# Patient Record
Sex: Male | Born: 1955 | Race: White | Hispanic: No | Marital: Married | Smoking: Current every day smoker
Health system: Southern US, Community
[De-identification: ages and names within clinical notes are randomized; demographics above are authoritative.]

## PROBLEM LIST (undated history)

## (undated) DIAGNOSIS — I209 Angina pectoris, unspecified: Secondary | ICD-10-CM

## (undated) DIAGNOSIS — E785 Hyperlipidemia, unspecified: Secondary | ICD-10-CM

## (undated) DIAGNOSIS — I252 Old myocardial infarction: Secondary | ICD-10-CM

## (undated) DIAGNOSIS — I2510 Atherosclerotic heart disease of native coronary artery without angina pectoris: Secondary | ICD-10-CM

## (undated) DIAGNOSIS — M1990 Unspecified osteoarthritis, unspecified site: Secondary | ICD-10-CM

## (undated) DIAGNOSIS — D696 Thrombocytopenia, unspecified: Secondary | ICD-10-CM

## (undated) DIAGNOSIS — R945 Abnormal results of liver function studies: Secondary | ICD-10-CM

## (undated) DIAGNOSIS — I10 Essential (primary) hypertension: Secondary | ICD-10-CM

## (undated) DIAGNOSIS — R7989 Other specified abnormal findings of blood chemistry: Secondary | ICD-10-CM

## (undated) DIAGNOSIS — E119 Type 2 diabetes mellitus without complications: Secondary | ICD-10-CM

## (undated) DIAGNOSIS — Z87898 Personal history of other specified conditions: Secondary | ICD-10-CM

## (undated) DIAGNOSIS — I219 Acute myocardial infarction, unspecified: Secondary | ICD-10-CM

## (undated) HISTORY — DX: Thrombocytopenia, unspecified: D69.6

## (undated) HISTORY — DX: Old myocardial infarction: I25.2

## (undated) HISTORY — DX: Other specified abnormal findings of blood chemistry: R79.89

## (undated) HISTORY — DX: Atherosclerotic heart disease of native coronary artery without angina pectoris: I25.10

## (undated) HISTORY — DX: Hyperlipidemia, unspecified: E78.5

## (undated) HISTORY — PX: CARDIAC CATHETERIZATION: SHX172

## (undated) HISTORY — DX: Abnormal results of liver function studies: R94.5

## (undated) HISTORY — DX: Personal history of other specified conditions: Z87.898

## (undated) HISTORY — PX: CORONARY ANGIOPLASTY WITH STENT PLACEMENT: SHX49

---

## 2014-05-31 DIAGNOSIS — I255 Ischemic cardiomyopathy: Secondary | ICD-10-CM

## 2014-05-31 HISTORY — DX: Ischemic cardiomyopathy: I25.5

## 2014-06-19 DIAGNOSIS — I2510 Atherosclerotic heart disease of native coronary artery without angina pectoris: Secondary | ICD-10-CM

## 2014-06-19 HISTORY — DX: Atherosclerotic heart disease of native coronary artery without angina pectoris: I25.10

## 2014-07-31 HISTORY — PX: CORONARY STENT PLACEMENT: SHX1402

## 2015-12-18 ENCOUNTER — Ambulatory Visit

## 2015-12-18 ENCOUNTER — Ambulatory Visit: Admitting: Emergency Medicine

## 2015-12-18 LAB — HX .AUTOMATED DIFF
CASE NUMBER: 2017140001006
HX ABSOLUTE NEUTRO COUNT: 2780 /mm3
HX BASOPHILS: 1 % — NL (ref 0.0–1.0)
HX EOSINOPHILS: 2 % — NL (ref 0.0–3.0)
HX IMMATURE GRANULOCYTES: 0 % — NL (ref 0.0–2.0)
HX LYMPHOCYTES: 34 % — NL (ref 22.0–40.0)
HX MONOCYTES: 12 % — ABNORMAL HIGH (ref 0.0–11.0)
HX NEU CT MEASURED: 2.78
HX NEUTROPHILS: 51 % — NL (ref 40.0–71.0)

## 2015-12-18 LAB — HX CBC W/ DIFF
CASE NUMBER: 2017140001006
HX HCT: 51.9 % — ABNORMAL HIGH (ref 39.0–50.0)
HX HGB: 18.1 g/dL — ABNORMAL HIGH (ref 13.0–17.0)
HX MCH: 35 pg — ABNORMAL HIGH (ref 27.0–31.0)
HX MCHC: 34.9 g/dL — NL (ref 32.0–36.0)
HX MCV: 100 fL — ABNORMAL HIGH (ref 80.0–94.0)
HX MPV: 11 fL — NL (ref 7.4–11.5)
HX NRBC PERCENT: 0 % — NL
HX PLATELET: 105 10*3/uL — ABNORMAL LOW (ref 150.0–400.0)
HX RBC: 5.17 10*6/uL — NL (ref 4.2–5.4)
HX RDW: 13.4 % — NL (ref 11.5–14.5)
HX WBC: 5.4 10*3/uL — NL (ref 3.6–10.5)

## 2015-12-18 LAB — HX COMPREHENSIVE METABOLIC PANEL
CASE NUMBER: 2017140001006
HX ALBUMIN LVL: 3.9 g/dL — NL (ref 3.5–5.0)
HX ALKALINE PHOSPHATASE: 64 U/L — NL (ref 45.0–117.0)
HX ALT: 34 U/L — NL (ref 6.0–78.0)
HX ANION GAP: 11 — NL (ref 3.0–11.0)
HX AST: 28 U/L — NL (ref 6.0–40.0)
HX BILIRUBIN TOTAL: 0.7 mg/dL — NL (ref 0.2–1.0)
HX BUN: 9 mg/dL — NL (ref 7.0–18.0)
HX CALCIUM LVL: 9.2 mg/dL — NL (ref 8.5–10.1)
HX CHLORIDE: 109 mmol/L — ABNORMAL HIGH (ref 98.0–107.0)
HX CO2: 21 mmol/L — NL (ref 21.0–32.0)
HX CREATININE: 0.907 mg/dL — NL (ref 0.55–1.3)
HX GLUCOSE LVL: 152 mg/dL — ABNORMAL HIGH (ref 65.0–110.0)
HX POTASSIUM LVL: 4 mmol/L — NL (ref 3.6–5.2)
HX SODIUM LVL: 141 mmol/L — NL (ref 136.0–145.0)
HX TOTAL PROTEIN: 7.3 g/dL — NL (ref 6.0–8.0)

## 2015-12-18 LAB — HX SST GOLD TUBE TO HOLD: CASE NUMBER: 2017140001006

## 2015-12-18 LAB — HX PT
CASE NUMBER: 2017140001006
HX INR: 1
HX PT: 10.7 s — NL (ref 9.3–11.6)

## 2015-12-18 LAB — HX TROPONIN I
CASE NUMBER: 2017140001006
CASE NUMBER: 2017140001175
HX TROPONIN I: <0.015 — NL (ref 0.015–0.045)
HX TROPONIN I: <0.015 — NL (ref 0.015–0.045)

## 2015-12-18 LAB — HX NT-PROBNP
CASE NUMBER: 2017140001006
HX NT-PROBNP: 53 pg/mL — NL (ref 0.0–900.0)

## 2015-12-18 LAB — HX GLOMERULAR FILTRATION RATE (ESTIMATED)
CASE NUMBER: 2017140001006
HX AFN AMER GLOMERULAR FILTRATION RATE: >90
HX NON-AFN AMER GLOMERULAR FILTRATION RATE: >90

## 2015-12-18 NOTE — Procedures (Signed)
cc:  Dorita Sciara M.D.    __________________________________________________________________________    NUCLEAR STRESS TEST  DATE:  12/20/2015    Please refer to Pyramis for the EKG portion stress test.    SUMMARY:  1.  10.2 mCi of Technetium-99 Myoview were injected intravenously prior to rest  imaging.  2.  0.4 mg of Lexiscan were injected intravenously by hand by a Registered Nurse  over a period of 20 seconds, followed by another 5-mL saline flush.  3.  29.8 mCi of Technetium-99 Myoview were injected immediately after, followed  by another 5-mL saline flush.  The patient then underwent SPECT imaging  approximately 60 minutes post injection.    FINDINGS:  1.  Spins Study:  Good.  2.  Gated Study:  Reveals inferior wall akinesis, mild global hypokinesis.  3.  End Diastolic Volume:  Calculated 148 mL.  4.  End Systolic Volume:  Calculated 102 mL.  5.  Ejection Fraction:  Calculated at 31%.  6.  Summed Stress Score:  22.    7.  Transient Ischemic Dilatation:  1.13.    INTERPRETATION:  1.  The technical quality of the study was good.  2.  Perfusion images at stress reveal a moderate sized inferior defect.  3.  Perfusion images at rest reveal mild improvement of the inferolateral wall.  4.  Gated SPECT imaging demonstrates mildly dilated left ventricle with reduced  left ventricular systolic function and inferior akinesis.    CONCLUSION:  1.  Abnormal study.  2.  Images suggest prior inferior myocardial infarction and potential mild  infralateral ischemia.    Dictated by:  Laurian Brim, M.D.    D:  12/20/2015 13:47:41  T:  12/20/2015 15:07:18  E:  12/20/2015 15:07:18  KM/dmb  Job# [id number]   SIGNATURE LINE    Electronically signed by Earney Mallet MD,Shandiin Eisenbeis J on 12/20/2015 at 20:39:10 EST

## 2015-12-19 LAB — HX CBC W/ DIFF
CASE NUMBER: 2017141001062
HX HCT: 48.8 % — NL (ref 39.0–50.0)
HX HGB: 16.9 g/dL — NL (ref 13.0–17.0)
HX MCH: 35 pg — ABNORMAL HIGH (ref 27.0–31.0)
HX MCHC: 34.6 g/dL — NL (ref 32.0–36.0)
HX MCV: 101 fL — ABNORMAL HIGH (ref 80.0–94.0)
HX MPV: 11.8 fL — ABNORMAL HIGH (ref 7.4–11.5)
HX NRBC PERCENT: 0 % — NL
HX PLATELET: 101 10*3/uL — ABNORMAL LOW (ref 150.0–400.0)
HX RBC: 4.84 10*6/uL — NL (ref 4.2–5.4)
HX RDW: 13.2 % — NL (ref 11.5–14.5)
HX WBC: 5.7 10*3/uL — NL (ref 3.6–10.5)

## 2015-12-19 LAB — HX BASIC METABOLIC PANEL
CASE NUMBER: 2017141001062
HX ANION GAP: 7 — NL (ref 3.0–11.0)
HX BUN: 11 mg/dL — NL (ref 7.0–18.0)
HX CALCIUM LVL: 8.7 mg/dL — NL (ref 8.5–10.1)
HX CHLORIDE: 108 mmol/L — ABNORMAL HIGH (ref 98.0–107.0)
HX CO2: 26 mmol/L — NL (ref 21.0–32.0)
HX CREATININE: 0.835 mg/dL — NL (ref 0.55–1.3)
HX GLUCOSE LVL: 99 mg/dL — NL (ref 65.0–110.0)
HX POTASSIUM LVL: 4.1 mmol/L — NL (ref 3.6–5.2)
HX SODIUM LVL: 141 mmol/L — NL (ref 136.0–145.0)

## 2015-12-19 LAB — HX .AUTOMATED DIFF
CASE NUMBER: 2017141001062
HX ABSOLUTE NEUTRO COUNT: 2970 /mm3
HX BASOPHILS: 0 % — NL (ref 0.0–1.0)
HX EOSINOPHILS: 4 % — ABNORMAL HIGH (ref 0.0–3.0)
HX IMMATURE GRANULOCYTES: 0 % — NL (ref 0.0–2.0)
HX LYMPHOCYTES: 29 % — NL (ref 22.0–40.0)
HX MONOCYTES: 13 % — ABNORMAL HIGH (ref 0.0–11.0)
HX NEU CT MEASURED: 2.97
HX NEUTROPHILS: 52 % — NL (ref 40.0–71.0)

## 2015-12-19 LAB — HX PROSTATE SPECIFIC ANTIGEN DIAGNOSTIC
CASE NUMBER: 2017141001062
HX PSA: 0.6 ng/mL — NL (ref 0.0–4.0)

## 2015-12-19 LAB — HX GLOMERULAR FILTRATION RATE (ESTIMATED)
CASE NUMBER: 2017141001062
HX AFN AMER GLOMERULAR FILTRATION RATE: >90
HX NON-AFN AMER GLOMERULAR FILTRATION RATE: >90

## 2015-12-19 LAB — HX TROPONIN I
CASE NUMBER: 2017141000504
HX TROPONIN I: <0.015 — NL (ref 0.015–0.045)

## 2015-12-19 LAB — HX IRON PROFILE
CASE NUMBER: 15234229
HX % IRON BOUND: 24 % — NL (ref 10.0–50.0)
HX FERRITIN LVL: 129 ng/mL — NL (ref 26.0–388.0)
HX IRON: 81 ug/dL — NL (ref 65.0–175.0)
HX TIBC: 337 ug/dL — NL (ref 250.0–450.0)

## 2015-12-19 LAB — HX BLUE TOP TO HOLD: CASE NUMBER: 2017141000504

## 2015-12-19 LAB — HX RED TOP TO HOLD: CASE NUMBER: 2017141001062

## 2015-12-19 LAB — HX LAVENDER TOP TO HOLD: CASE NUMBER: 2017141000504

## 2015-12-19 LAB — HX SST GOLD TUBE TO HOLD: CASE NUMBER: 2017141001062

## 2015-12-21 LAB — HX ERYTHROPOIETIN LEVEL
CASE NUMBER: 2017141001062
HX ERYTHROPOIETIN LVL: 15 mU/mL

## 2015-12-23 ENCOUNTER — Ambulatory Visit: Admitting: Cardiovascular Disease

## 2015-12-23 NOTE — Progress Notes (Signed)
* * *        **  [name]**    --- ---    45 Y old Male, DOB: 07-22-1956    [address], [place], [place] [address]    Home: [phone]    Provider: Darlyn Chamber, MD        * * *    Telephone Encounter    ---    Answered by   Illene Bolus, Francois Elk  Date: 12/23/2015         Time: 12:23 PM    Reason   STRESS TEST    --- ---            Action Taken   [facility] , MD 12/23/2015 12:25:45 PM > Please call:  Stress test with small abnormality and if he is feeling well, will discuss  with him at appointment next month Nogueira,Ana 12/24/2015 10:55:26 AM > called  pt LMOM                * * *                ---          * * *          Patient: [name], [name] DOB: 1955-10-26 Provider: Darlyn Chamber, MD  12/23/2015    ---    Note generated by eClinicalWorks EMR/PM Software ([email or web address])

## 2015-12-24 ENCOUNTER — Ambulatory Visit

## 2016-01-14 ENCOUNTER — Ambulatory Visit: Admitting: Cardiovascular Disease

## 2016-01-14 NOTE — Progress Notes (Signed)
* * *        **  [name]**    --- ---    34 Y old Male, DOB: 1955-09-18    [address], [place], [place] [address]    Home: [phone]    Provider: Darlyn Chamber, MD        * * *    Telephone Encounter    ---    Answered by   Illene Bolus, Dolphus Linch  Date: 01/14/2016         Time: 05:44 PM    Reason   ECHO    --- ---            Action Taken   [facility] , MD 01/14/2016 5:44:33 PM > Please schedule  echo to reassess LVEF Man,Sokhom 01/17/2016 4:33:58 PM > call and spoke w/  p.t's wife, booked echo on 02/09/16 at 8:00am. Emailed portuguese interp.                * * *                ---          * * *          PatientJANIS, [name] DOB: [birth year] Provider: Darlyn Chamber, MD  01/14/2016    ---    Note generated by eClinicalWorks EMR/PM Software ([email or web address])

## 2016-01-14 NOTE — Progress Notes (Signed)
* * *         [facility], [facility]**        ---    Lawernce Ion. Sindy Messing, MD [facility] Kieth Brightly. Donnetta Simpers, MD [facility] Barbera Setters Byrd Hesselbach, MD  [facility];    Darlyn Chamber, MD [facility] Roosvelt Maser, MD [facility] Shanon Brow, MD [facility]  Nile Dear. Audley Hose, MD [facility]    Kandis Mannan A. Karie Mainland, MD [facility] Johnell Comings. MacNaught, MD [facility] Mitchel Honour, MD Erma Bodiford, MD [facility]    Colletta Maryland, MD Weston Anna, MD [facility] Kerby Moors, NP Maximino Greenland ,  NP        * * *     **Patient Name:** [name]   **Date:** 01/14/2016    --- ---     **DOB:** 1955-08-21     **Referring Provider:** Dorita Sciara, MD   **Appointment Provider:** Trecia Rogers, MD        * * *    01/14/2016  Progress Notes: Trecia Rogers, MD    --- ---    ---        Current Medications    ---    Taking     * Aspirin 325 mg 1 tab Oral qd    ---    * clopidogrel 75 mg Tablet 1 tab(s) orally once a day    ---    * citalopram 20 mg tablet 1 tab(s) orally once a day    ---    * cyclobenzaprine 10 mg tablet 1 tab(s) orally Q.D.    ---    * lisinopril 5 mg Tablet 1 tab(s) orally once a day at bedtime    ---    * Vitamin D3 50,000 intl units capsule 1 cap(s) orally once a wk    ---    * isosorbide mononitrate 30 mg tablet, extended release 1 tab(s) orally once a day (in the morning)    ---    * furosemide (Lasix) 20 mg tablet 1 tab(s) orally mon-wed-fri    ---    * Vitamin B12 1000 mcg tablet 1 tab(s) orally once a day    ---    * meclizine 25 mg tablet 1 tab(s) orally 3 times a day prn    ---    Not-Taking    * atorvastatin (Lipitor) 20 mg tablet 1 tab(s) orally once a day (at bedtime)    ---    * Medication List reviewed and reconciled with the patient    ---      Past Medical History    ---      CAD --> 90% OMB1 s/p DES (Promus 2x5 x 20) and 100% pRCA s/p DES(Promus 2x5  x 32), diffuse disease otherwise    ---    CM with LVEF 35% by cath and 45% by echo 11/15    ---    Hyperlipidemia    ---    Depression    ---    Vertigo    ---    ETOH in past     ---    Prior Smoker    ---    Denies hx of HTN, DM, CVA, TIA, MI, asthma, or GI bleeds    ---    Thrombocytopenia (low 100s), seen by hematologist [facility] 2/16    ---      Family History    ---      Father: deceased 71 yrs    ---    Mother:  deceased 22 yrs    ---    Siblings: alive, diagnosed with Heart Disease    ---    All brothers (4) with heart diease -    Oldest brother had CABG    Middle brother with PTCI    younger brother PPM.    ---      Social History    ---    Smoking Status: current smoker, How often do you smoke cigarettes? every day,  Patient counseled on the dangers of tobacco use: 01/14/2016.    Advised to lose weight: Yes .    No Presence of Falls .    ETOH: Prior heavy drinker; now not drinking (1 year or so).    no Recreational drug use.   Married; 4 chidren; workes as Administrator, arts;  originally from [place].    ---      Allergies    ---      atorvastatin: myalgias with 20 mg qd dose: Side Effects    ---        Reason for Appointment    ---      1\. F/u CAD, LV dysfunction s/p 2 vessel PTCI, EF 35-45%, HTN, dyslipidemia    ---    2\. Cigarette abuse    ---      History of Present Illness    ---     _HPI_ :    60 year old man presents today for followup and is accompanied by interpreter    Coronary artery disease s/p PTCI RCA and OMB (drug-eluting stents) 11/15:  Feels well and denies exertional chest, arm or jaw symptoms. He can walk 1  mile without difficulty although not sure whether he can walk 2 miles. He does  c/o intermittent epigastric symptoms    LV systolic dysfunction and ischemic cardiomyopathy: Denies any shortness of  breath with current level of activity and no orthopnea or PND. Somewhat  careful with salt in his diet and no orthopnea or PND. Denis palpitations,  unprovoked dizzy spells or syncope    Hyperlipidemia: Developed myalgias related to atorvastatin with bilateral leg  pains that resolved completely off atorvastatin and is currently not on a  statin    Cigar/Cigarette  abuse: smoking 4-5 cig/day.      Vital Signs    ---    HR 61, BP 142/90, Wt 176, Ht 68, BMI 26.76, Repeat BP 142/78, Med Assist: pd.      Examination    ---     _Reveals_ :    General appearance Well appearing man in NAD, accompanied by interpreter.  HEENT Normocephalic, atraumatic, Sclera anicteric, oropharynx clear. Neck exam  No JVD/bruits, Trachea in midline. Chest Symetrical to expansion . Lungs clear  to auscultation . Cardiac exam Nondisplaced PMI, Regular heart sounds without  S3, S4, murmurs or rubs, No heaves or thrills, Normal S1,physiologically split  S2. Abdomen Soft, nondistended, nontender with normal bowel sounds, No bruits  or pulsatile masses. Extremity No edema, clubbing or cyanosis, pulses 2+.  Neuro Grossly nonfocal motor exam. Skin warm and dry without rashes. Psych  Mood is appropriate; alert and oriented times 3.          Data    ---     _EKG (reviewed personally)_ :    NSR @ 61, OIMI and IVCD, Delayed R wave progression, minor nonspecific lateral  ST changes, mildly prolonged QT/QTc - similar to prior .    _Echo_ :    2/16: LVEF 45%, severe hypokinesis of  inferior wall and inferoseptaum a/2 mild  to moderate MR, similar to 11/15.    _Ankle Brachial Index_ :    12/16: Normal (1.2 R and 1.3 L).    _Lexiscan Myoview_ :    5/17 [facility]: LVEF 31% with inferior akinesis; inferior scar and mild peri-infarct  ischemia .    _Cardiac catheterization_ :    11/15: EF 35%, mild MR, diffuse CAD, pLAD 30%, mLAD 20 - 30%, pRCA 100% s/p  PTCI (2.5x32 Promus) DES and OMB1 90% stenosis s/p PTCI (2.5x 20 Promus) DES.          Impression/Recommendations    ---       **1\. Native vessel CAD without anginal symptoms**    Stop Aspirin, 325 mg, 1 tab, Oral, qd    Start Aspirin , 81 mg, 1 tablet, once daily, 90 Days, Refills 4    Stop isosorbide mononitrate tablet, extended release, 30 mg, 1 tab(s), orally,  once a day (in the morning)    _IMAGING: **EKG_    Clinical Notes: 2 vessel coronary disease with occluded right  coronary artery  and OMB stenosis in setting of ACS, s/p PTCI of both lesions with drug-eluting  stents 11/15 without anginal symptoms. Stress test 5/17 reviewed with him  notable for moderate sized inferior scar and mild amount of peri-infact  ischemia. In light of absence of symptoms and mild peri-infarct ischemia,  would favor continued medical therapy and he is on board. In terms of medical  therapy, recommend reducing aspirin to 81 mg qd and rationale d/w him and  epigastric symptoms may be related to higher dose aspirin and smoking. No  clear role for Imdur at this time and recommend discontinuing as well. To call  for any exertional symptoms and 911 for persistent symptoms > 20 min .    ---        **2\. Left ventricular failure**    Stop furosemide (Lasix) tablet, 20 mg, 1 tab(s), orally, Mon-Wed-Fridays    _IMAGING: **Echocardiogram (Ordered for 01/14/2016)_    Clinical Notes: NYHA II symptoms without exam evidence for CHF with LVEF 45%  at last echo although at recent stress test, calculated LVEF 31%. I suspect  that echo EF is closer to accurate and small risk for potentially fatal  arrythmias d/w him and to call for any new symptoms. I do recommend repeating  echo to reassess LVEF and to continue with salt restriction. No CHF and at  this time, will discontinue lasix 20 mg 3/week and change to HCTZ.        **3\. Hypercholesterolemia**    Stop atorvastatin (Lipitor) tablet, 20 mg, 1 tab(s), orally, once a day (at  bedtime)    Start Crestor tablet, 20 mg, 1 tab(s), orally, once a day (at bedtime), 30  day(s), 30, Refills 11    _LAB: Liver function tests (Ordered for 03/15/2016)_    _LAB: Lipid Profile (Ordered for 03/15/2016)_    Clinical Notes: Had atorvastatin related myalgias with bilateral leg pains  that completely resolved off atorvastatin and feels quite well at this point  with normal ABI PVR. We discussed the rationale for statins and recommend  trial of Crestor 20 mg daily and if tolerated, plan  is to repeat lipids in 2  months time to assess adequacy of therapy.        **4\. Abnormal stress test with nuclear imaging**    _IMAGING: **Echocardiogram (Ordered for 01/14/2016)_    Clinical Notes: As above.        **  5\. Cigarette smoker, current**    Clinical Notes: Disastrous consequences of continued smoking discussed with  him and strongly emphasized absolute and complete smoke cessation.        **6\. S/p PTCI**    Clinical Notes: Rationale for dual antiplatelet therapy discussed with him and  to continue for now given ACS, 2 stents and continued smoking.        **7\. Hypertension**    Stop lisinopril Tablet, 5 mg, 1 tab(s), orally, once a day at bedtime    Start lisinopril-hctz tablet, 12.5 mg-10 mg, 1 tab(s), orally, once a day, 30  day(s), 30 Tablet, Refills 11    _LAB: BMP (Ordered for 01/28/2016)_    Clinical Notes: Has hypertension and blood pressure is mildly elevated today.  Isosorbide mononitrate and Lasix are being discontinued and I recommend  increasing lisinopril to 10 mg a day and adding hydrochlorothiazide 12.5 mg  daily to his regimen and combination pill prescription sent to his pharmacy  and to be taken once a day in the morning. Potential side effects relating to  both lisinopril and hydrochlorothiazide discussed with him and will arrange  for chem 6 in 2 weeks' time to assess potassium and renal function. Outpatient  blood pressure checks are recommended with plans for follow-up with our nurse  practitioner in 3-4 weeks time to assess tolerance of above medication changes  as well as adequacy of blood pressure control.      Procedure Codes    ---      [address] IH    ---      Follow Up    ---    6 Months; 3-4 weeks with NP    Electronically signed by Darlyn Chamber MD on 01/14/2016 at 05:44 PM EDT    Sign off status: Completed        * * *        [facility]    [address]    [place], [place] [address]    Tel: [phone]    Fax: [phone]              * *  *          Patient: [name], [name] DOB: Sep 21, 1955 Progress Note: Trecia Rogers, MD  01/14/2016    ---    Note generated by eClinicalWorks EMR/PM Software ([email or web address])

## 2016-01-28 ENCOUNTER — Ambulatory Visit: Admitting: Cardiovascular Disease

## 2016-02-09 ENCOUNTER — Ambulatory Visit: Admitting: Cardiovascular Disease

## 2016-02-09 NOTE — Progress Notes (Signed)
* * *        **  [name]**    --- ---    89 Y old Male, DOB: Nov 18, 1955    [address], [place], [place] [address]    Home: [phone]    Provider: Darlyn Chamber, MD        * * *    Telephone Encounter    ---    Answered by   Mary Sella  Date: 02/09/2016         Time: 11:01 AM    Reason   no show echo .    --- ---            Message                      r/s from 7/12                Action Taken   Carlson,Dyann 02/11/2016 3:52:23 PM > LMOM to return my call  Carlson,Dyann 02/23/2016 3:35:51 PM > r/s 03/13/16                * * *                ---          * * *          Patient: [name], [name] DOB: 02-Sep-1955 Provider: Darlyn Chamber, MD  02/09/2016    ---    Note generated by eClinicalWorks EMR/PM Software ([email or web address])

## 2016-02-14 ENCOUNTER — Ambulatory Visit

## 2016-02-14 ENCOUNTER — Ambulatory Visit: Admitting: Nurse Practitioner

## 2016-02-14 LAB — HX COMPREHENSIVE METABOLIC PANEL
CASE NUMBER: 2017198000857
HX ALBUMIN LVL: 3.9 g/dL — NL (ref 3.5–5.0)
HX ALKALINE PHOSPHATASE: 58 U/L — NL (ref 45.0–117.0)
HX ALT: 60 U/L — NL (ref 6.0–78.0)
HX ANION GAP: 9 — NL (ref 3.0–11.0)
HX AST: 43 U/L — ABNORMAL HIGH (ref 6.0–40.0)
HX BILIRUBIN TOTAL: 0.4 mg/dL — NL (ref 0.2–1.0)
HX BUN: 9 mg/dL — NL (ref 7.0–18.0)
HX CALCIUM LVL: 8.9 mg/dL — NL (ref 8.5–10.1)
HX CHLORIDE: 109 mmol/L — ABNORMAL HIGH (ref 98.0–107.0)
HX CO2: 25 mmol/L — NL (ref 21.0–32.0)
HX CREATININE: 0.851 mg/dL — NL (ref 0.55–1.3)
HX GLUCOSE LVL: 97 mg/dL — NL (ref 65.0–110.0)
HX POTASSIUM LVL: 3.9 mmol/L — NL (ref 3.6–5.2)
HX SODIUM LVL: 143 mmol/L — NL (ref 136.0–145.0)
HX TOTAL PROTEIN: 7.3 g/dL — NL (ref 6.0–8.0)

## 2016-02-14 LAB — HX GLOMERULAR FILTRATION RATE (ESTIMATED)
CASE NUMBER: 2017198000857
HX AFN AMER GLOMERULAR FILTRATION RATE: >90
HX NON-AFN AMER GLOMERULAR FILTRATION RATE: >90

## 2016-02-14 LAB — HX CBC W/ DIFF
CASE NUMBER: 2017198000857
HX HCT: 45.9 % — NL (ref 39.0–50.0)
HX HGB: 16.2 g/dL — NL (ref 13.0–17.0)
HX MCH: 35 pg — ABNORMAL HIGH (ref 27.0–31.0)
HX MCHC: 35.3 g/dL — NL (ref 32.0–36.0)
HX MCV: 99 fL — ABNORMAL HIGH (ref 80.0–94.0)
HX MPV: 11.1 fL — NL (ref 7.4–11.5)
HX NRBC PERCENT: 0 % — NL
HX PLATELET: 132 10*3/uL — ABNORMAL LOW (ref 150.0–400.0)
HX RBC: 4.63 10*6/uL — NL (ref 4.2–5.4)
HX RDW: 13.2 % — NL (ref 11.5–14.5)
HX WBC: 5.5 10*3/uL — NL (ref 3.6–10.5)

## 2016-02-14 LAB — HX .AUTOMATED DIFF
CASE NUMBER: 2017198000857
HX ABSOLUTE NEUTRO COUNT: 2960 /mm3
HX BASOPHILS: 0 % — NL (ref 0.0–1.0)
HX EOSINOPHILS: 1 % — NL (ref 0.0–3.0)
HX IMMATURE GRANULOCYTES: 0 % — NL (ref 0.0–2.0)
HX LYMPHOCYTES: 33 % — NL (ref 22.0–40.0)
HX MONOCYTES: 11 % — NL (ref 0.0–11.0)
HX NEU CT MEASURED: 2.96
HX NEUTROPHILS: 54 % — NL (ref 40.0–71.0)

## 2016-02-14 NOTE — Other (Addendum)
[facility] Nurse - [place] Intake Entered On:  02/14/2016 14:48     Performed On:  02/14/2016 14:39  by Ralene Cork               Vitals/Height/Weight   Peripheral Pulse Rate :   77 bpm   Respiratory Rate :   20 br/min   Systolic Blood Pressure :   145 mmHg (HI)    Diastolic Blood Pressure :   81 mmHg   Mean Arterial Pressure :   102 mmHg   Height :   172.72 cm(Converted to: 5 foot 8 in, 68.00 in)    Body Surface Area :   1.93 m2   Weight :   78.8 Kg(Converted to: 173 lb 12 oz)    Body Mass Index :   26.41 Kg/m2   Type of Weight :   Standing   Type of Height :   Stated height   Ralene Cork - 02/14/2016 14:39    [facility] General Information/Subjective   Chief Complaint :   f/u low platelets   High Risk for Falls :   No   Preferred Language :   [place]   Preferred Communication Mode :   Verbal   Interpreter Decline :   No   Name of Interpreter/ID Number :   Gislene   Pain Symptoms :   No   Ralene Cork - 02/14/2016 14:39    [facility] Infectious Disease Risk Screening   Patient Travel History :   No recent travel   Recent Family Travel History :   No recent travel   Ralene Cork - 02/14/2016 14:39    Allergy   (As Of: 02/14/2016 14:48:54 )   Allergies (Active)   NKA  Estimated Onset Date:   Unspecified ; Created By:   Manson Passey RN, Deborah; Reaction Status:   Active ; Category:   Drug ; Substance:   NKA ; Type:   Allergy ; Updated By:   Aurea Graff; Reviewed Date:   02/14/2016 14:47         Medication History   Medication List   (As Of: 02/14/2016 14:48:54 )   Prescription/Discharge Order    furosemide  :   furosemide ; Status:   Prescribed ; Ordered As Mnemonic:   furosemide 20 mg oral tablet ; Simple Display Line:   20 mg, 1 tab(s), PO, Daily, 30 tab(s), 0 Refill(s) ; Ordering Provider:   Shelle Iron MD, Susy Frizzle; Catalog Code:   furosemide ; Order Dt/Tm:   06/20/2014 13:53:27          atorvastatin  :   atorvastatin ; Status:   Prescribed ; Ordered As Mnemonic:   atorvastatin 20 mg oral tablet ; Simple Display Line:   20 mg, 1  tab(s), PO, HS, 30 tab(s), 0 Refill(s) ; Ordering Provider:   Shelle Iron MD, Susy Frizzle; Catalog Code:   atorvastatin ; Order Dt/Tm:   06/20/2014 13:53:17          metoprolol  :   metoprolol ; Status:   Prescribed ; Ordered As Mnemonic:   metoprolol tartrate 25 mg oral tablet ; Simple Display Line:   25 mg, 1 tab(s), PO, BID, 60 tab(s), 0 Refill(s) ; Ordering Provider:   Shelle Iron MD, Susy Frizzle; Catalog Code:   metoprolol ; Order Dt/Tm:   06/20/2014 13:53:34          isosorbide mononitrate  :   isosorbide mononitrate ; Status:   Prescribed ; Ordered As Mnemonic:   Imdur 30 mg oral  tablet, extended release ; Simple Display Line:   30 mg, 1 tab(s), PO, qAM, for 30 day(s), 30 tab(s), 0 Refill(s) ; Ordering Provider:   Parvatini MD, Harless Litten; Catalog Code:   isosorbide mononitrate ; Order Dt/Tm:   12/20/2015 14:44:55          clopidogrel  :   clopidogrel ; Status:   Prescribed ; Ordered As Mnemonic:   clopidogrel 75 mg oral tablet ; Simple Display Line:   75 mg, 1 tab(s), PO, Daily, 30 tab(s), 0 Refill(s) ; Ordering Provider:   Shelle Iron MD, Susy Frizzle; Catalog Code:   clopidogrel ; Order Dt/Tm:   06/20/2014 13:53:21          aspirin  :   aspirin ; Status:   Prescribed ; Ordered As Mnemonic:   aspirin 81 mg oral tablet, chewable ; Simple Display Line:   81 mg, 1 tab(s), PO, Daily, 30 tab(s), 0 Refill(s) ; Ordering Provider:   Shelle Iron MD, Susy Frizzle; Catalog Code:   aspirin ; Order Dt/Tm:   06/20/2014 13:53:14            Home Meds    cholecalciferol  :   cholecalciferol ; Status:   Documented ; Ordered As Mnemonic:   Vitamin D3 ; Simple Display Line:   1,000 Unit(s), PO, Daily, 0 Refill(s) ; Catalog Code:   cholecalciferol ; Order Dt/Tm:   06/16/2014 11:05:19          citalopram  :   citalopram ; Status:   Documented ; Ordered As Mnemonic:   citalopram 20 mg oral tablet ; Simple Display Line:   20 mg, 1 tab(s), PO, Daily, 0 Refill(s) ; Catalog Code:   citalopram ; Order Dt/Tm:   02/20/2014 10:14:28          lisinopril  :   lisinopril ; Status:    Documented ; Ordered As Mnemonic:   lisinopril 5 mg oral tablet ; Simple Display Line:   5 mg, 1 tab(s), PO, Daily, TK 1 T PO QHS ; Catalog Code:   lisinopril ; Order Dt/Tm:   12/18/2015 14:48:30            [facility] Social History   Cigarette Smoking Last 365 Days :   No   Exposure to Tobacco Smoke :   Former smoker   Tobacco products in past 30 days? :   No   Ralene Cork - 02/14/2016 14:39    Social History   (As Of: 02/14/2016 14:48:54 )   Tobacco:        Current some day smoker, Cigarettes, 5 per day.   (Last Updated: 06/16/2014 05:04:08  by Pernell Dupre RN, Clarisse Gouge)          Alcohol:  Medium Risk      Past, Daily   Comments:  06/16/2014 5:03 - Pernell Dupre RN, Clarisse Gouge: Pt states has not had any ETOH x 1mo   (Last Updated: 06/16/2014 05:03:49  by Pernell Dupre RN, Clarisse Gouge)   Current, Wine, Liquor, Several times per day, Alcohol use interferes with work or home: Yes.  Drinks more than intended: Yes.  Others hurt by drinking: Yes.  Ready to change: Yes.  Household alcohol concerns: Yes.   (Last Updated: 12/18/2015 21:23:37  by Elroy Channel)          Employment/School:        Employed   (Last Updated: 04/02/2014 14:15:09  by Margaretha Glassing RN, Jan)          Exercise:  Exercise type: Walking.   (Last Updated: 04/02/2014 14:15:15  by Margaretha Glassing RN, Jan)          Nutrition/Health:        Regular   (Last Updated: 04/02/2014 14:15:19  by Margaretha Glassing RN, Jan)          Home/Environment:        Lives with Children, Spouse.   Comments:  04/02/2014 14:15 - Margaretha Glassing RN, Jan: 2 children ,2 grandchildren   (Last Updated: 04/02/2014 14:15:47  by Margaretha Glassing RN, Jan)            Advance Directive   Advanced Directives :   Yes   Advance Directive Type :   Health Care Proxy   Ralene Cork - 02/14/2016 14:39    [facility] Encounter   Onc Type of Patient :   Outpatient Visit Existing   Onc Visit Level, Existing :   Level 1   Ralene Cork - 02/14/2016 14:39

## 2016-02-14 NOTE — Other (Signed)
Patient:   [name], [name]            MRN: [id number]            FIN: [id number]               Age:   60 years     Sex:  Male     DOB:  07/26/1956   Associated Diagnoses:   None   Author:   Melina [name] ACNP, [name]      Visit Information   Pt is seen under the supervision of Dr. Donita [name].  Last on 08/09/15 for follow up of his thrombocytopenia.  Seen today for 6 follow up visit.  Seen with the aide of a portuguese interpreter, [name].      Chief Complaint   No complaints.        Interval History   [name] is a 60 year old portuguese speaking male referred to the Hematology Clinic on 04/02/14 for evaluation of thrombocytopenia.     PMH significant for hypertension, gout and dyslipidemia, Acute MI with placement of 2 stents on 05/2014, started on plavix and ASA.    Hematology History:    In 01/2014, pt was assessed by his PCP for generalized fatigue, feeling lightheaded, nauseous, and treated with supportive care.  At the time, laboratory studies revealed mild thrombocytopenia of 117,000 and repeat studies a few day later confirmed similar numbers of low platelet count.  MCV was slightly elevated to 95.        Review of Systems   Night sweats much better, 1x/week.  Left sided body pain for over a year, [name] suspects arthritis, Limited ROM in left shoulder.  No Nausea. Pt currently taking aleve for headaches, about every 2 weeks. no vomiting.  No diarrhea/constipation.  denies melena.  No oral discomfort.  No CP. SOB sometimes with activity.  No coughing.  tingling at tips of fingers, chronic.  No changes to skin, easy bruising.  Stopped smoking after heart attack.  No alcohol.  Loves to go fishing.  No motorcycle riding due to the pain in his shoulder.  No s&s of bleeding at this time.        Health Status   Allergies:    Allergies (1) Active Reaction  NKA None Documented     Current medications: unable to review list, pt left his medication list in his car.  States his cardiac medications were adjusted after his last  admission   Problem list:    Active Problems (8)  Abdominal pain   Congenital low platelets   Depression   HTN   Hyperlipemia   Impaired skin integrity   Palpitations   Thrombocytopenia         Histories   Procedure history:    DIL CA 2 SITE W/RX-ELUT IL DEV PERQ (621308 Z) on 06/19/2014 at 60 Years.  DIL CA 2 SITE W/RX-ELUT IL DEV PERQ (657846 Z) on 06/19/2014 at 60 Years.  DIL CA 2 SITE W/RX-ELUT IL DEV PERQ (962952 Z) on 06/19/2014 at 60 Years.  Colonoscopy (45.23) on 06/17/2012 at 55 Years.      Physical Examination   Vital Signs   02/14/2016 14:39  Peripheral Pulse Rate 77 bpm    Respiratory Rate 20 br/min    Systolic Blood Pressure 145 mmHg  HI    Diastolic Blood Pressure 81 mmHg    Mean Arterial Pressure 102 mmHg      Measurements from flowsheet : Measurements   02/14/2016 14:39  Height 172.72 cm  Type of Height Stated height    Weight 78.8 Kg    Type of Weight Standing    BSA 1.93 m2    Body Mass Index 26.41 Kg/m2      General:  Alert and oriented, No acute distress, well-developed, well-groomed., portuguese interpreter, [name], weight loss of 4kg in past 6 months, VSS.    Eye:  Normal conjunctiva.    HENT:  Oral mucosa is moist, No pharyngeal erythema, No sinus tenderness.    Neck:  Supple, Non-tender, No lymphadenopathy.    Respiratory:  Lungs are clear to auscultation, Respirations are non-labored, Breath sounds are equal, Symmetrical chest wall expansion.    Cardiovascular:  Regular rhythm, No gallop, Good pulses equal in all extremities, Normal peripheral perfusion, No edema, soft 2/6 murmur noted, irregular HR.    Gastrointestinal:  Soft, Non-tender, Normal bowel sounds.    Lymphatics:  No lymphadenopathy neck, axilla, groin.    Musculoskeletal     Normal range of motion.     Normal gait.     no spinal tenderness.     Integumentary:  Warm, Dry, Intact, No pallor, No rash.    Neurologic:  Cranial Nerves II-XII are grossly intact.    Cognition and Speech:  Speech clear and coherent, Functional cognition  intact.    Psychiatric:  Cooperative, Appropriate mood & affect, Normal judgment.       Review / Management   Results review:  Lab results   02/14/2016 09:09  Glucose Lvl 97 mg/dL    BUN 9 mg/dL    Creatinine 1.610 mg/dL    Afn Amer Glomerular Filtration Rate >90 ml/min/1.3m2  NA    Non-Afn Amer Glomerular Filtration Rate >90 ml/min/1.42m2  NA    Sodium Lvl 143 mmol/L    Potassium Lvl 3.9 mmol/L    Chloride 109 mmol/L  HI    CO2 25 mmol/L    Anion Gap 9    Total Protein 7.3 Gm/dL    Albumin Lvl 3.9 Gm/dL    Calcium Lvl 8.9 mg/dL    Bilirubin Total 0.4 mg/dL    Alkaline Phosphatase 58 Units/L    AST 43 Units/L  HI    ALT 60 Units/L    WBC 5.5 thous/mm3    RBC 4.63 Mil/mm3    Hgb 16.2 Gm/dL    Hct 96.0 %    Platelet 132 thous/mm3  LOW    MCV 99 fL  HI    MCH 35 pGm  HI    MCHC 35.3 Gm/dL    RDW 45.4 %    MPV 09.8 fL    Neutro Auto 54 %    Lymph Auto 33 %    Mono Auto 11 %    Eos Auto 1 %    Basophil Auto 0 %    IG Auto 0 %    NRBC Percent 0 %    Neu Ct Absolute 2,960 /mm3  NA   .       Impression and Plan   60 year old male referred to the Hematology Clinic on 04/02/14 for evaluation of thrombocytopenia.  Review of his labs shows persistent platelet counts in the low 100's.  Workup is negative for an autoimmune etiology (RA factor and ANA are negative) and liver shows slightly heterogeneous echotexture suggesting diffuse parenchymal disease otherwise no other abnormality noted with negative hepatitis panel and normal LFTs, and splenic size is normal.  There is a slight abnormality noted in his cardiolipin IgM in 04/02/14 when it was moderately  high at 15. Repeat in 08/2014 was normal at 12.  Abdominal US done on 04/13/14 shows slightly heterogeneous hepatic echotexture suggesting diffuse parenchymal disease. No evidence of hepatosplenomegaly.  Atherosclerotic disease of the aorta.    Pt is well appearing with no new complaints.  Most recent labs drawn today shows chronic thrombocytopenia with a platelet count of 132K.  Pt  continues to take Plavix and ASA.  He was admitted on 12/18/15 for substernal chest pain.  Changed his medications and continues to follow up with Dr. Illene Bolus, every 6 months.  Last annual abd. US done on 08/16/15 was entirely normal.  Clinically, he is doing well with improvement in his report of night sweats and full resolution of his occasional nausea.  No s&s of bleeding.  Pt states he no longer drinks any alcohol, even socially.  Next clinic visit in 6 months with repeat labs.      Plan  - Next clinic visit in 6 months with repeat labs  - Continue close follow up with his cardiologist.  - Call office if he experiences any s&s of bleeding.  - Repeat US in 1 year - due 07/2016  - Maintain plt count > 50K due to treatment with plavix and ASA  - cc note to Dr. Illene Bolus, [name], Dr. Gerilyn Nestle    Pt encouraged to call with any questions or concerns.  Pt understands and concurs with the treatment plan.          Reviewed and electronically verified by:  Albin Fischer                                                                       on:  02/15/2016 09:37    Reviewed and electronically authenticated by:  Conni Slipper                                                                         on:  02/15/16 09:37

## 2016-02-14 NOTE — Progress Notes (Signed)
* * *        [facility], [facility]**        ---    Lawernce Ion. Sindy Messing, MD [facility] Kieth Brightly. Donnetta Simpers, MD [facility] Barbera Setters Byrd Hesselbach, MD  [facility];    Darlyn Chamber, MD [facility] Roosvelt Maser, MD [facility] Shanon Brow, MD [facility]  Nile Dear. Audley Hose, MD [facility]    Kandis Mannan A. Karie Mainland, MD [facility] Johnell Comings. MacNaught, MD [facility] Mitchel Honour, MD Erma Scroggins, MD [facility]    Colletta Maryland, MD Weston Anna, MD [facility] Kerby Moors, NP Maximino Greenland ,  NP        * * *     **Patient Name:** [name]  **Date:** 02/14/2016    ------     **DOB:** 10/31/1955     **Referring Provider:** Dorita Sciara, MD  **Appointment Provider:** Purnell Shoemaker, ACNP        * * *    02/14/2016 Progress Notes: Purnell Shoemaker, ACNP    ------    ---      Current Medications    ---    Taking    * Aspirin 81 mg 1 tablet once daily    ---    * Crestor 20 mg tablet 1 tab(s) orally once a day (at bedtime)    ---    * lisinopril-hctz 12.5 mg-10 mg tablet 1 tab(s) orally once a day    ---    * clopidogrel 75 mg Tablet 1 tab(s) orally once a day    ---    * citalopram 20 mg tablet 1 tab(s) orally once a day    ---    * cyclobenzaprine 10 mg tablet 1 tab(s) orally Q.D.    ---    * Vitamin D3 50,000 intl units capsule 1 cap(s) orally once a wk    ---    * Vitamin B12 1000 mcg tablet 1 tab(s) orally once a day    ---    * meclizine 25 mg tablet 1 tab(s) orally 3 times a day prn    ---    * Medication List reviewed and reconciled with the patient    ---     Past Medical History    ---     CAD --> 90% OMB1 s/p DES (Promus 2x5 x 20) and 100% pRCA s/p DES(Promus 2x5  x 32), diffuse disease otherwise    ---    CM with LVEF 35% by cath and 45% by echo 11/15    ---    Hyperlipidemia    ---    Depression    ---    Vertigo    ---    ETOH in past    ---    Prior Smoker    ---    Denies hx of HTN, DM, CVA, TIA, MI, asthma, or GI bleeds    ---    Thrombocytopenia (low 100s), seen by hematologist [facility] 2/16    ---     Family History    ---      Father: deceased 32 yrs    ---    Mother: deceased 78 yrs    ---    Siblings: alive    ---    All brothers (4) with heart diease -    Oldest brother had CABG    Middle brother with PTCI    younger brother PPM.    ---     Social History    ---  Smoking: yes Status: current smoker 3-4 cigs ppd, How often do you smoke  cigarettes? every day, Patient counseled on the dangers of tobacco use:  02/14/2016.    Advised to lose weight: Yes .    No Presence of Falls .    no ETOH, Prior heavy drinker; now not drinking (1 year or so).    no Recreational drug use.  Married; 4 chidren; workes as Administrator, arts;  originally from [place].    ---     Allergies    ---     atorvastatin: myalgias with 20 mg qd dose: Side Effects    ---     Review of Systems    ---     _BC_ :    Fatigue No. Weight gain No. Weight loss No. Weakness No. Fevers No. Chills No.  Shortness of breath No . Cough No . Dyspnea on exertion No. Wheezes No. Chest  pain No. Chest tightness No. Palpitations No. LE edema No. PND No. Orthopnea  No. Presyncope No. Syncope No. Lightheadedness No. Dizziness No. Falls No.  Easy bleeding/bruising No. Nausea No. Vomiting No. Abdominal pain No. Melena,  black stools No. Joint pains No. Claudication No.          Reason for Appointment    ---     1\. Follow up: htn, cad, med changes, tobacco    ---     History of Present Illness    ---     _HPI_ :    Patient is a 60 year old man followed by Dr Illene Bolus who is her for follow  up with interpreter. He has CAD s/p PCI/stent in the past. He had abnormal  stress showing mod sized inf scar with small periinfarct ischemia, hld  intollerant to atorvastatin due to myalgias, switched to crestor, htn,  switched to lisinpril/hct 10/12.5mg  last visit and imdur was discontinued. He  was supposed to have echo but no showed. He also was encouraged to quit  soking.    Coronary artery disease s/p PTCI RCA and OMB (drug-eluting stents) 11/15: No  chest pain. Feels great. No cp/sob  and walks daily without any symptoms.    LV systolic dysfunction and ischemic cardiomyopathy: No sob, edema, pnd or  orthopnea. He did not show up for echocardiogram. Didn't know about it. Will  reschedule    Hyperlipidemia: Developed myalgias related to atorvastatin with bilateral leg  pains that resolved completely off atorvastatin and was switched to crestor  which he is tolerating without myalgias.    Cigar/Cigarette abuse: smoking 3-4 cigs per day. Encouraged him to use  nicotine patches which he has at home, and/or gum to quit. Stressed importance  to quit. Hard to say if he wants to quit at this time.     Vital Signs    ---    HR 70, BP 144/96, Wt 173, Ht 68, BMI 26.30, Repeat BP 158/92, Med Assist: Ah    97%.     Examination    ---     _Reveals_ :    General appearance Well appearing man in NAD. HEENT Normocephalic, atraumatic,  Sclera anicteric, MMM. Neck exam No JVD/bruits, Trachea in midline. Chest  Symetrical to expansion . Lungs clear to auscultation . Cardiac exam  Nondisplaced PMI, Regular heart sounds without S3, S4, murmurs or rubs, No  heaves or thrills, Normal S1,physiologically split S2. Abdomen Soft,  nondistended, nontender with normal bowel sounds, No bruits or pulsatile  masses. Extremity No edema, clubbing or cyanosis, pulses  2+. Neuro Grossly  nonfocal motor exam. Skin warm and dry without rashes. Psych Mood is  appropriate; alert and oriented times 3.         Data    ---     _EKG (reviewed personally)_ :    NSR @ 61, OIMI and IVCD, Delayed R wave progression, minor nonspecific lateral  ST changes, mildly prolonged QT/QTc - similar to prior .    _Echo_ :    2/16: LVEF 45%, severe hypokinesis of inferior wall and inferoseptaum a/2 mild  to moderate MR, similar to 11/15.    _Ankle Brachial Index_ :    12/16: Normal (1.2 R and 1.3 L).    _Lexiscan Myoview_ :    5/17 [facility]: LVEF 31% with inferior akinesis; inferior scar and mild peri-infarct  ischemia .    _Cardiac catheterization_ :    11/15:  EF 35%, mild MR, diffuse CAD, pLAD 30%, mLAD 20 - 30%, pRCA 100% s/p  PTCI (2.5x32 Promus) DES and OMB1 90% stenosis s/p PTCI (2.5x 20 Promus) DES.         Impression/Recommendations    ---      **1\. Native vessel CAD without anginal symptoms**    Increase lisinopril-hctz tablet, 12.5 mg-20 mg, 1 tab(s), orally, once a day,  30 day(s), 30 Tablet, Refills 5    _LAB: BMP (Ordered for 02/21/2016)_    Clinical Notes: Patient with h/o CAD s/p OMB and RCA intervention 11/15 with  no anginal symptoms. His bp remains elevated and he continues to smoke 3-4  cigs per day. Will tolerate Ace inhibitor and stressed RF reduction,  especially smoking cessation. Continue active lifestyle and heart healthy  diet. Will continue DAPT and tolerating crestor. Will see back after echo  rescheduled in 6wks to reassess bp control.    ---        **2\. Left ventricular failure**    Clinical Notes: Pt with h/o LV dysfunction, EF 35-45%. Lexi myoview 5/17  suggested LV function 31%. Pt did not show for echo and has no signs of  clinical heart failure. Will titrate Ace and check labs. Will reschedule echo  and see back in 6-8wks for re-assessment.        **3\. Hypercholesterolemia**    Notes: Patient Educated with: [email or web address].pdf ([email or web address].pdf).    Clinical Notes: Tolerating crestor withou myalgias. Encouraged heart healthy  diet.        **4\. Cigarette smoker, current**    Clinical Notes: Patient will try nicotine patch/gum to quit smoking entirely.  He likes his 3-4 cigs, seems reluctant to quit, but says he will try.        **5\. S/p PTCI**    Clinical Notes: Continue DAPT, statin, and quit smoking!.        **6\. Hypertension**    _LAB: BMP (Ordered for 02/21/2016)_    Clinical Notes: Will increase lisiinopril/hct to 20/12.5mg  daily and check BMP  in 7-10 days. Low sodium diet encouraged. Pt likes salted fish.        **7\. Others**    Notes: Reschedule echocardiogram    Increase lisinopril/hctz 20/12.5mg  daily    fu with  me in 6-8wks.     Follow Up    ---    6 Weeks Sue Lush    Electronically signed by Lazarus Salines MD on 02/15/2016 at 07:24 PM EDT    Sign off status: Completed        * * [facility]    [address]    [place], [place] [address]    Tel: [phone]    Fax: [phone]              * * *         Patient: [name], [name] DOB: 08/31/1955 Progress Note: Purnell Shoemaker,  ACNP 02/14/2016    ---    Note generated by eClinicalWorks EMR/PM Software ([email or web address])

## 2016-02-15 ENCOUNTER — Ambulatory Visit

## 2016-02-15 LAB — HX CARDIOLIPIN AB IGG & IGM
CASE NUMBER: 2017198000857
HX CARDIOLIPIN IGG: 1 [GPL'U]
HX CARDIOLIPIN IGM: 12 [MPL'U]

## 2016-02-21 ENCOUNTER — Ambulatory Visit

## 2016-03-13 ENCOUNTER — Ambulatory Visit

## 2016-03-13 NOTE — Progress Notes (Signed)
**  Progress Notes**    ---    **Patient:** [name], [name]     **Account Number:** [id number]  **Provider:** [id number] Per Diem zz     **DOB:** 08-01-1955 **Age:** 89 Y **Sex:** Male  **Date:** 03/13/2016     **Phone:** [phone]     **Address:** [address], [place], [id number]     **Pcp:** DAMIAN FOLCH, MD        * * *        **Subjective:**        ---      **Chief Complaints:**    ------      1\. per Sanch;rs from ns.    ------     **Medical History:** CAD --> 90% OMB1 s/p DES (Promus 2x5 x 20) and 100%  pRCA s/p DES(Promus 2x5 x 32), diffuse disease otherwise, CM with LVEF 35% by  cath and 45% by echo 11/15, Hyperlipidemia, Depression, Vertigo, ETOH in past,  Prior Smoker, Denies hx of HTN, DM, CVA, TIA, MI, asthma, or GI bleeds,  Thrombocytopenia (low 100s), seen by hematologist [facility] 2/16.    ------     **Medications:** None    ------      **Allergies:** atorvastatin: myalgias with 20 mg qd dose: Side Effects.    ------        **Objective:**        ---         **Assessment:**        ---      **Assessment:**    1\. Left ventricular failure - I50.1    2\. Abnormal stress test with nuclear imaging - R94.30    ------        **Plan:**        ---       **1\. Left ventricular failure**    _Imaging: **Echocardiogram_    ---     **2\. Abnormal stress test with nuclear imaging**    _Imaging: **Echocardiogram_      **Procedure Codes:** 93306 TRANSTHORACIC ECHO, COMPLETE W/DOPPLER & COLOR    ------          **Provider:** [facility] Per Diem zz    ---     **Patient:** [name], [name] **DOB:** 1956-02-08 **Date:** 03/13/2016    ---    Electronically signed by Lynnda Shields on 03/13/2016 at 11:19 AM EDT    Sign off status: Completed

## 2016-03-15 ENCOUNTER — Ambulatory Visit: Admitting: Cardiovascular Disease

## 2016-03-16 ENCOUNTER — Ambulatory Visit: Admitting: Cardiovascular Disease

## 2016-03-16 NOTE — Progress Notes (Signed)
* * *         [facility], [facility]**        ---    Lawernce Ion. Sindy Messing, MD [facility] Kieth Brightly. Donnetta Simpers, MD [facility] Barbera Setters Byrd Hesselbach, MD  [facility];    Darlyn Chamber, MD [facility] Roosvelt Maser, MD [facility] Shanon Brow, MD [facility]  Nile Dear. Audley Hose, MD [facility]    Kandis Mannan A. Karie Mainland, MD [facility] Johnell Comings. MacNaught, MD [facility] Mitchel Honour, MD Erma Pua, MD [facility]    Colletta Maryland, MD Weston Anna, MD [facility] Kerby Moors, NP Maximino Greenland ,  NP        * * *     **Patient Name:** [name]   **Date:** 03/16/2016    --- ---     **DOB:** [birth year]     **Referring Provider:** Dorita Sciara, MD   **Appointment Provider:** Trecia Rogers, MD        * * *    03/16/2016  Progress Notes: Trecia Rogers, MD    --- ---    ---        Current Medications    ---    Taking     * lisinopril-hctz 12.5 mg-20 mg tablet 1 tab(s) orally once a day    ---    * Aspirin 81 mg 1 tablet once daily    ---    * Crestor 20 mg tablet 1 tab(s) orally once a day (at bedtime)    ---    * clopidogrel 75 mg Tablet 1 tab(s) orally once a day    ---    * citalopram 20 mg tablet 1 tab(s) orally once a day    ---    * cyclobenzaprine 10 mg tablet 1 tab(s) orally Q.D.    ---    * Vitamin D3 50,000 intl units capsule 1 cap(s) orally once a wk    ---    * Vitamin B12 1000 mcg tablet 1 tab(s) orally once a day    ---    * meclizine 25 mg tablet 1 tab(s) orally 3 times a day prn    ---      Past Medical History    ---      CAD --> 90% OMB1 s/p DES (Promus 2x5 x 20) and 100% pRCA s/p DES(Promus 2x5  x 32), diffuse disease otherwise    ---    CM with LVEF 35% by cath and 45% by echo 11/15    ---    Hyperlipidemia    ---    Depression    ---    Vertigo    ---    ETOH in past    ---    Prior Smoker    ---    Denies hx of HTN, DM, CVA, TIA, MI, asthma, or GI bleeds    ---    Thrombocytopenia (low 100s), seen by hematologist [facility] 2/16    ---      Family History    ---      Father: deceased 7 yrs    ---    Mother: deceased 52 yrs    ---    Siblings:  alive, diagnosed with Heart Disease    ---    All brothers (4) with heart diease -    Oldest brother had CABG    Middle brother with PTCI    younger brother PPM.    ---      Social History    ---  Smoking Status: former smoker Quit 7/17, How long has it been since you last  smoked? < 1 month, Patient counseled on the dangers of tobacco use:  03/16/2016.    Advised to lose weight: Yes .    No Presence of Falls .    ETOH: Prior heavy drinker; now not drinking (1 year or so).    no Recreational drug use.   Married; 4 chidren; workes as Administrator, arts;  originally from [place].    ---      Allergies    ---      atorvastatin: myalgias with 20 mg qd dose: Side Effects    ---        Reason for Appointment    ---      1\. F/u CAD, LV dysfunction s/p 2 vessel PTCI, EF 35-45%, HTN, dyslipidemia    ---    2\. Cigarette abuse    ---      History of Present Illness    ---     _HPI_ :    60 year old man presents today for followup (Ana, our [facility] Negron Torres served as  interpreter)    Coronary artery disease s/p PTCI RCA and OMB (drug-eluting stents) 11/15:  Feels well and denies exertional chest, arm or jaw symptoms although describes  fleeting chest symptoms lasting 1-2 seconds occasionally. He can walk 1 mile  without difficulty although not sure whether he can walk 2 miles.    LV systolic dysfunction and ischemic cardiomyopathy: Denies shortness of  breath with current level of activity and denies orthopnea or PND. He is  somewhat careful with salt in his diet and denies palpitations, dizzy spells  or syncope    Hyperlipidemia: Developed myalgias related to atorvastatin with bilateral leg  pains that resolved completely off atorvastatin and was switched to crestor  that is is tolerating without myalgias    Cigar/Cigarette abuse: Quit 1 month ago but strong urge to smoke.      Vital Signs    ---    HR 88, BP 138/96, Wt 175, Ht 68, BMI 26.61, Repeat BP 138/84, Med Assist: sm.      Examination    ---     _Reveals_ :    General appearance  Well appearing man in NAD . HEENT Normocephalic,  atraumatic, Sclera anicteric, oropharynx clear. Neck exam No JVD/bruits,  Trachea in midline. Chest Symetrical to expansion . Lungs clear to  auscultation . Cardiac exam Nondisplaced PMI, Regular heart sounds without S3,  S4, murmurs or rubs, No heaves or thrills, Normal S1,physiologically split S2.  Abdomen Soft, nondistended, nontender with normal bowel sounds, No bruits or  pulsatile masses. Extremity No edema, clubbing or cyanosis, pulses 2+. Neuro  Grossly nonfocal motor exam. Skin warm and dry without rashes. Psych Mood is  appropriate; alert and oriented times 3.          Data    ---     _EKG (reviewed personally)_ :    NSR @ 61, OIMI and IVCD, Delayed R wave progression, minor nonspecific lateral  ST changes, mildly prolonged QT/QTc - similar to prior .    _Echo_ :    8/17: LVEF 45%, severe hypokinesis of inferior wall and inferoseptaum a/w  trace to mild MR; C/w prior echo, LVEF similar and MR less notable.    _Ankle Brachial Index_ :    12/16: Normal (1.2 R and 1.3 L).    _Lexiscan Myoview_ :    5/17 [facility]: LVEF 31% with inferior akinesis;  inferior scar and mild peri-infarct  ischemia .    _Cardiac catheterization_ :    11/15: EF 35%, mild MR, diffuse CAD, pLAD 30%, mLAD 20 - 30%, pRCA 100% s/p  PTCI (2.5x32 Promus) DES and OMB1 90% stenosis s/p PTCI (2.5x 20 Promus) DES.          Impression/Recommendations    ---       **1\. Native vessel CAD without anginal symptoms**    Refill clopidogrel Tablet, 75 mg, 1 tab(s), orally, once a day, 90 days, 90,  Refills 3    Clinical Notes: 2 vessel coronary disease with occluded right coronary artery  and OMB stenosis in setting of ACS, s/p PTCI of both lesions with drug-eluting  stents 11/15 and has had no anginal symptoms since then. Fleeting 1-2 seconds  of chest symptoms nonischemic based on history. Stress test 5/17 reviewed with  him notable for moderate sized inferior scar and mild amount of peri-infact  ischemia  and based on findings and absence of symptoms, medical therapy would  be optimal strategy. To call for any exertional symptoms and 911 for  persistent symptoms > 20 min .    ---        **2\. Left ventricular failure**    Start carvedilol tablet, 6.25 mg, 1 tab(s), orally, 2 times a day, 30 day(s),  60, Refills 11    Clinical Notes: NYHA II symptoms without exam evidence for CHF and repeat Echo  with LVEF 45% recently and results reviewed with him. He would benefit  immensely from a betablocker in light of stress test findings and LV  dysfunction and will start coreg 6.25 mg bid although will take 1/2 tab bid  for 2 days and then increase to 1 tablet bid. In terms of LV dysfunction, we  discussed CHF, importance of being careful with salt in diet as well as risk  for potentially fatal arrythmias. LVEF is much above MADIT II criteria and  will call for any new symptoms.        **3\. Hypercholesterolemia**    Refill Crestor tablet, 20 mg, 1 tab(s), orally, once a day (at bedtime), 90  days, 90 tablets, Refills 3    _LAB: Lipid Profile (Ordered for 03/16/2016)_    Notes: Patient Educated with: [email or web address].pdf ([email or web address].pdf).    Clinical Notes: Had atorvastatin related myalgias with bilateral leg pains  that completely resolved off atorvastatin and is tolerating rosuvastatin 20 mg  daily. Did not have lipids drawn (he thought he had them last month but none  at [facility] and did have labs otherwise). Will request lipids again.        **4\. Abnormal stress test with nuclear imaging**    Clinical Notes: As above.        **5\. S/p PTCI**    Clinical Notes: Rationale for dual antiplatelet therapy reviewed and to  continue for now.        **6\. Hypertension**    Refill lisinopril-hctz tablet, 12.5 mg-20 mg, 1 tab(s), orally, once a day, 90  days, 90, Refills 3    Clinical Notes: Well controlled on current regimen.        **7\. h/o Cigarette abuse; currently in remission**    Clinical Notes: Congratulated on smoke  cessation and benefits reviewed with  him extensively.      Follow Up    ---    6 Months    Electronically signed by Darlyn Chamber MD on 03/16/2016 at 12:42 PM EDT    Sign  off status: Completed        * * *        [facility]    [address]    [place], [place] [address]    Tel: [phone]    Fax: [phone]              * * *          Patient: [name], [name] DOB: 09-03-1955 Progress Note: Trecia Rogers, MD  03/16/2016    ---    Note generated by eClinicalWorks EMR/PM Software ([email or web address])

## 2016-03-16 NOTE — Progress Notes (Signed)
* * *        **  [name]**    --- ---    42 Y old Male, DOB: April 17, 1956    [address], [place], [place] [address]    Home: [phone]    Provider: Darlyn Chamber, MD        * * *    Telephone Encounter    ---    Answered by   Illene Bolus, Nuha Degner  Date: 03/16/2016         Time: 12:42 PM    Reason   LIPIDS    --- ---            Action Taken   [facility] , MD 03/16/2016 12:43:34 PM > Did not have  lipids drawn. Please have him get lipids (fasting) in next week or so. Please  mail slip to him if he does not have prior slip. Thanks Nogueira,Ana 03/16/2016  1:32:16 PM > faxed lab slip called pt spoke with wife                * * *              * * *        ---        Reason for Appointment    ---      1\. LIPIDS    ---      Current Medications    ---      None    ---      Past Medical History    ---      CAD --> 90% OMB1 s/p DES (Promus 2x5 x 20) and 100% pRCA s/p DES(Promus 2x5  x 32), diffuse disease otherwise    ---    CM with LVEF 35% by cath and 45% by echo 11/15    ---    Hyperlipidemia    ---    Depression    ---    Vertigo    ---    ETOH in past    ---    Prior Smoker    ---    Denies hx of HTN, DM, CVA, TIA, MI, asthma, or GI bleeds    ---    Thrombocytopenia (low 100s), seen by hematologist [facility] 2/16    ---      Allergies    ---      atorvastatin: myalgias with 20 mg qd dose: Side Effects    ---      Assessments    ---    1\. Hypercholesterolemia - E78.0 (Primary)    ---      Impression/Recommendations    ---       **1\. Hypercholesterolemia**    _LAB: Lipid Profile (Ordered for 03/16/2016)_    ---          * * *           PatientWELTON, [name] DOB: [birth year] Provider: Darlyn Chamber, MD  03/16/2016    ---    Note generated by eClinicalWorks EMR/PM Software ([email or web address])

## 2016-03-20 ENCOUNTER — Ambulatory Visit: Admitting: Cardiovascular Disease

## 2016-03-20 LAB — HX LIPID PANEL
CASE NUMBER: 2017233001061
HX CHOL: 148 mg/dL — NL (ref 140.0–200.0)
HX HDL: 55 mg/dL — NL (ref 41.0–60.0)
HX LDL: 70 mg/dL — NL (ref 0.0–129.0)
HX TRIG: 117 mg/dL — NL (ref 0.0–149.0)

## 2016-03-23 ENCOUNTER — Ambulatory Visit: Admitting: Cardiovascular Disease

## 2016-03-23 NOTE — Progress Notes (Signed)
* * *        **  [name]**    --- ---    80 Y old Male, DOB: Dec 25, 1955    [address], [place], [place] [address]    Home: [phone]    Provider: Darlyn Chamber, MD        * * *    Telephone Encounter    ---    Answered by   Illene Bolus, Catelynn Sparger  Date: 03/23/2016         Time: 08:47 PM    Reason   LIPIDS    --- ---            Action Taken   [facility] , MD 03/23/2016 8:48:07 PM > Please call:  lipids were acceptable and LDL (bad cholesterol) was 70 and should be 70 or  less. No change in meds recommended. Nogueira,Ana 03/24/2016 9:29:51 AM >  called pt LMOM relayed message                * * *                ---          * * *          PatientALFREDO, [name] DOB: 1955-12-22 Provider: Darlyn Chamber, MD  03/23/2016    ---    Note generated by eClinicalWorks EMR/PM Software ([email or web address])

## 2016-08-07 ENCOUNTER — Ambulatory Visit: Admitting: Nurse Practitioner

## 2016-08-14 ENCOUNTER — Ambulatory Visit

## 2016-08-14 ENCOUNTER — Ambulatory Visit: Admitting: Nurse Practitioner

## 2016-08-14 LAB — HX CBC W/ DIFF
CASE NUMBER: 2018015001134
HX HCT: 47.2 % — NL (ref 39.0–50.0)
HX HGB: 16.3 g/dL — NL (ref 13.0–17.0)
HX MCH: 34 pg — ABNORMAL HIGH (ref 27.0–31.0)
HX MCHC: 34.5 g/dL — NL (ref 32.0–36.0)
HX MCV: 98 fL — ABNORMAL HIGH (ref 80.0–94.0)
HX MPV: 11.5 fL — NL (ref 7.4–11.5)
HX NRBC PERCENT: 0 % — NL
HX PLATELET: 129 10*3/uL — ABNORMAL LOW (ref 150.0–400.0)
HX RBC: 4.8 10*6/uL — NL (ref 4.2–5.4)
HX RDW: 13.2 % — NL (ref 11.5–14.5)
HX WBC: 5.6 10*3/uL — NL (ref 3.6–10.5)

## 2016-08-14 LAB — HX ELECTROLYTE PANEL
CASE NUMBER: 2018015001135
HX ANION GAP: 6 — NL (ref 3.0–11.0)
HX CHLORIDE: 105 mmol/L — NL (ref 98.0–110.0)
HX CO2: 29 mmol/L — NL (ref 21.0–32.0)
HX POTASSIUM LVL: 3.9 mmol/L — NL (ref 3.6–5.2)
HX SODIUM LVL: 140 mmol/L — NL (ref 136.0–145.0)

## 2016-08-14 LAB — HX .AUTOMATED DIFF
CASE NUMBER: 2018015001134
HX ABSOLUTE NEUTRO COUNT: 2930 /mm3
HX BASOPHILS: 1 % — NL (ref 0.0–1.0)
HX EOSINOPHILS: 2 % — NL (ref 0.0–3.0)
HX IMMATURE GRANULOCYTES: 0 % — NL (ref 0.0–2.0)
HX LYMPHOCYTES: 31 % — NL (ref 22.0–40.0)
HX MONOCYTES: 14 % — ABNORMAL HIGH (ref 0.0–11.0)
HX NEU CT MEASURED: 2.93
HX NEUTROPHILS: 52 % — NL (ref 40.0–71.0)

## 2016-08-14 LAB — HX HEPATIC FUNCTION PANEL
CASE NUMBER: 2018015001134
HX ALBUMIN LVL: 4.2 g/dL — NL (ref 3.5–5.0)
HX ALKALINE PHOSPHATASE: 66 U/L — NL (ref 45.0–117.0)
HX ALT: 75 U/L — NL (ref 6.0–78.0)
HX AST: 60 U/L — ABNORMAL HIGH (ref 6.0–40.0)
HX BILIRUBIN DIRECT: 0.1 mg/dL — NL (ref 0.0–0.3)
HX BILIRUBIN TOTAL: 0.5 mg/dL — NL (ref 0.2–1.2)
HX TOTAL PROTEIN: 8 g/dL — NL (ref 6.0–8.0)

## 2016-08-14 LAB — HX BUN
CASE NUMBER: 2018015001135
HX BUN: 11 mg/dL — NL (ref 7.0–18.0)

## 2016-08-14 LAB — HX GLOMERULAR FILTRATION RATE (ESTIMATED)
CASE NUMBER: 2018015001135
HX AFN AMER GLOMERULAR FILTRATION RATE: >90
HX NON-AFN AMER GLOMERULAR FILTRATION RATE: >90

## 2016-08-14 LAB — HX CREATININE LEVEL
CASE NUMBER: 2018015001135
HX CREATININE: 0.843 mg/dL — NL (ref 0.55–1.3)

## 2016-08-14 NOTE — Other (Addendum)
[facility] Nurse - Johnson Intake Entered On:  08/14/2016 10:13     Performed On:  08/14/2016 10:09  by Burke Keels               Vitals/Height/Weight   Peripheral Pulse Rate :   61 bpm   Respiratory Rate :   20 br/min   Systolic Blood Pressure :   147 mmHg (HI)    Diastolic Blood Pressure :   98 mmHg (HI)    Mean Arterial Pressure :   114 mmHg   SpO2 :   96 %   Oxygen Therapy 1 :   Room air   Height :   172 cm(Converted to: 5 foot 8 in, 67.72 in)    Body Surface Area :   1.92 m2   Weight :   79.3 Kg(Converted to: 174 lb 13 oz)    Body Mass Index :   26.81 Kg/m2   Type of Weight :   Standing   Type of Height :   Stated height   Burke Keels - 08/14/2016 10:09    [facility] General Information/Subjective   Chief Complaint :   follow up on low plat[place]   High Risk for Falls :   No   Preferred Language :   [place]   Preferred Communication Mode :   Verbal   Interpreter Decline :   No   Pain Symptoms :   No   Burke Keels - 08/14/2016 10:09    [facility] Infectious Disease Risk Screening   Chills :   No   Fever :   No   Fatigue :   No   Headache :   No   Runny or Stuffy Nose :   No   Sore Throat :   No   Shortness of Breath :   No   New or Worsening Cough :   No   Vomiting :   No   Diarrhea :   No   Muscle Pain :   No   Recent Exposure to Communicable Disease :   No   Illness With Generalized Rash :   No   Burke Keels - 08/14/2016 10:09    Patient Travel History :   No recent travel   Recent Family Travel History :   No recent travel   Burke Keels - 08/14/2016 10:09    Allergy   (As Of: 08/14/2016 10:13:11 )   Allergies (Active)   NKA  Estimated Onset Date:   Unspecified ; Created By:   Manson Passey RN, Deborah; Reaction Status:   Active ; Category:   Drug ; Substance:   NKA ; Type:   Allergy ; Updated By:   Aurea Graff; Reviewed Date:   08/14/2016 10:10         Medication History   Medication List   (As Of: 08/14/2016 10:13:11 )   Prescription/Discharge Order    isosorbide mononitrate  :   isosorbide  mononitrate ; Status:   Prescribed ; Ordered As Mnemonic:   Imdur 30 mg oral tablet, extended release ; Simple Display Line:   30 mg, 1 tab(s), PO, qAM, for 30 day(s), 30 tab(s), 0 Refill(s) ; Ordering Provider:   Parvatini MD, Harless Litten; Catalog Code:   isosorbide mononitrate ; Order Dt/Tm:   12/20/2015 14:44:55          aspirin  :   aspirin ; Status:   Prescribed ; Ordered As Mnemonic:   aspirin 81 mg oral tablet,  chewable ; Simple Display Line:   81 mg, 1 tab(s), PO, Daily, 30 tab(s), 0 Refill(s) ; Ordering Provider:   Shelle Iron MD, Susy Frizzle; Catalog Code:   aspirin ; Order Dt/Tm:   06/20/2014 13:53:14          atorvastatin  :   atorvastatin ; Status:   Prescribed ; Ordered As Mnemonic:   atorvastatin 20 mg oral tablet ; Simple Display Line:   20 mg, 1 tab(s), PO, HS, 30 tab(s), 0 Refill(s) ; Ordering Provider:   Shelle Iron MD, Susy Frizzle; Catalog Code:   atorvastatin ; Order Dt/Tm:   06/20/2014 13:53:17          clopidogrel  :   clopidogrel ; Status:   Prescribed ; Ordered As Mnemonic:   clopidogrel 75 mg oral tablet ; Simple Display Line:   75 mg, 1 tab(s), PO, Daily, 30 tab(s), 0 Refill(s) ; Ordering Provider:   Shelle Iron MD, Susy Frizzle; Catalog Code:   clopidogrel ; Order Dt/Tm:   06/20/2014 13:53:21          furosemide  :   furosemide ; Status:   Prescribed ; Ordered As Mnemonic:   furosemide 20 mg oral tablet ; Simple Display Line:   20 mg, 1 tab(s), PO, Daily, 30 tab(s), 0 Refill(s) ; Ordering Provider:   Shelle Iron MD, Susy Frizzle; Catalog Code:   furosemide ; Order Dt/Tm:   06/20/2014 13:53:27          metoprolol  :   metoprolol ; Status:   Prescribed ; Ordered As Mnemonic:   metoprolol tartrate 25 mg oral tablet ; Simple Display Line:   25 mg, 1 tab(s), PO, BID, 60 tab(s), 0 Refill(s) ; Ordering Provider:   Shelle Iron MD, Susy Frizzle; Catalog Code:   metoprolol ; Order Dt/Tm:   06/20/2014 13:53:34            Home Meds    lisinopril  :   lisinopril ; Status:   Documented ; Ordered As Mnemonic:   lisinopril 5 mg oral tablet ; Simple Display  Line:   5 mg, 1 tab(s), PO, Daily, TK 1 T PO QHS ; Catalog Code:   lisinopril ; Order Dt/Tm:   12/18/2015 14:48:30          cholecalciferol  :   cholecalciferol ; Status:   Documented ; Ordered As Mnemonic:   Vitamin D3 ; Simple Display Line:   1,000 Unit(s), PO, Daily, 0 Refill(s) ; Catalog Code:   cholecalciferol ; Order Dt/Tm:   06/16/2014 11:05:19          citalopram  :   citalopram ; Status:   Documented ; Ordered As Mnemonic:   citalopram 20 mg oral tablet ; Simple Display Line:   20 mg, 1 tab(s), PO, Daily, 0 Refill(s) ; Catalog Code:   citalopram ; Order Dt/Tm:   02/20/2014 10:14:28            [facility] Social History   Cigarette Smoking Last 365 Days :   No   Exposure to Tobacco Smoke :   Former smoker   Tobacco products in past 30 days? :   No   Burke Keels - 08/14/2016 10:09    Social History   (As Of: 08/14/2016 10:13:11 )   Tobacco:        Current some day smoker, Cigarettes, 5 per day.   (Last Updated: 06/16/2014 05:04:08  by Pernell Dupre RN, Clarisse Gouge)          Alcohol:  Medium Risk  Past, Daily   Comments:  06/16/2014 5:03 - Pernell Dupre RN, Clarisse Gouge: Pt states has not had any ETOH x 29mo   (Last Updated: 06/16/2014 05:03:49  by Pernell Dupre RN, Clarisse Gouge)   Current, Wine, Liquor, Several times per day, Alcohol use interferes with work or home: Yes.  Drinks more than intended: Yes.  Others hurt by drinking: Yes.  Ready to change: Yes.  Household alcohol concerns: Yes.   (Last Updated: 12/18/2015 21:23:37  by Elroy Channel)          Employment/School:        Employed   (Last Updated: 04/02/2014 14:15:09  by Margaretha Glassing RN, Jan)          Exercise:        Exercise type: Walking.   (Last Updated: 04/02/2014 14:15:15  by Margaretha Glassing RN, Jan)          Nutrition/Health:        Regular   (Last Updated: 04/02/2014 14:15:19  by Margaretha Glassing RN, Jan)          Home/Environment:        Lives with Children, Spouse.   Comments:  04/02/2014 14:15 - Margaretha Glassing RN, Jan: 2 children ,2 grandchildren   (Last Updated: 04/02/2014 14:15:47  by Margaretha Glassing RN,  Jan)            Advance Directive   Advanced Directives :   Yes   Advance Directive Type :   Health Care Proxy   Burke Keels - 08/14/2016 10:09    [facility] Encounter   Onc Type of Patient :   Outpatient Visit Existing   Onc Visit Level, Existing :   Level 1   Burke Keels - 08/14/2016 10:09

## 2016-08-14 NOTE — Other (Signed)
Patient:   [name], [name]            MRN: [id number]            FIN: [id number]               Age:   61 years     Sex:  Male     DOB:  July 11, 1956   Associated Diagnoses:   None   Author:   Melina Schools ACNP, Harriett Sine      Visit Information   Pt is seen under the supervision of Dr. Donita Brooks.  Last on 02/14/16 for follow up of his thrombocytopenia.  Seen today for 6 follow up visit.  Seen with the aid of a [place] interpreter.       Chief Complaint   No complaints. Feels a little tired.         Interval History   [name] is a 61 year old portuguese speaking male referred to the Hematology Clinic on 04/02/14 for evaluation of thrombocytopenia.     PMH significant for hypertension, gout and dyslipidemia, Acute MI with placement of 2 stents on 05/2014, currently on ASA, stopped plavix in 02/2016.     Hematology History:    In 01/2014, pt was assessed by his PCP for generalized fatigue, feeling lightheaded, nauseous, and treated with supportive care.  At the time, laboratory studies revealed mild thrombocytopenia of 117,000 and repeat studies a few day later confirmed similar numbers of low platelet count.  MCV was slightly elevated to 95.        Review of Systems   Night sweats much better, 2-3x/week.  Left sided body pain for over a year, Dr. Morley Kos suspects arthritis, Limited ROM in left shoulder.  Nausea sometimes in the AM, going on for awhile.  No vomiting.  No headaches.  No diarrhea/constipation.  denies melena, hematuria.  No oral discomfort.  No CP.  SOB sometimes with activity.  No coughing.  tingling at tips of fingers, chronic.  No changes to skin, stopped plavix 4-5 months ago, only on baby ASA.  Stopped smoking after heart attack.  Drinks 1-2 glasses of wine a week.  Loves to go fishing.  No motorcycle riding due to the pain in his shoulder.  No s&s of bleeding at this time.  Sees Dr. Illene Bolus every 6 months.  Sees PCP every 6 months.        Health Status   Allergies:    Allergies (1)  Active Reaction  NKA None Documented     Current medications:  (Selected)   Prescriptions  Prescribed  aspirin 81 mg oral tablet, chewable: 81 mg, 1 tab(s), PO, Daily, 30 tab(s), 0 Refill(s)  cyclobenzaprine 10 mg oral tablet: 10 mg, 1 tab(s), PO, TID, for 21 day(s), PRN: for spasm, 40 tab(s), 0 Refill(s)  Documented Medications  Documented  Vitamin D3: 1,000 Unit(s), PO, Daily, 0 Refill(s)  citalopram 20 mg oral tablet: 20 mg, 1 tab(s), PO, Daily, 0 Refill(s)  lisinopril 5 mg oral tablet: 5 mg, 1 tab(s), PO, Daily, TK 1 T PO QHS   Problem list:    Active Problems (8)  Abdominal pain   Congenital low platelets   Depression   HTN   Hyperlipemia   Impaired skin integrity   Palpitations   Thrombocytopenia         Histories   Procedure history:    DIL CA 2 SITE W/RX-ELUT IL DEV PERQ (621308 Z) on 06/19/2014 at 57 Years.  DIL CA 2 SITE W/RX-ELUT IL  DEV PERQ (161096 Z) on 06/19/2014 at 57 Years.  DIL CA 2 SITE W/RX-ELUT IL DEV PERQ (045409 Z) on 06/19/2014 at 57 Years.  Colonoscopy (45.23) on 06/17/2012 at 55 Years.      Physical Examination   Vital Signs   08/14/2016 10:09  Peripheral Pulse Rate 61 bpm    Respiratory Rate 20 br/min    Systolic Blood Pressure 147 mmHg  HI    Diastolic Blood Pressure 98 mmHg  HI    Mean Arterial Pressure 114 mmHg    SpO2 96 %    Oxygen Therapy Room air      Measurements from flowsheet : Measurements   08/14/2016 10:09  Height 172 cm    Type of Height Stated height    Weight 79.3 Kg    Type of Weight Standing    BSA 1.92 m2    Body Mass Index 26.81 Kg/m2      General:  Alert and oriented, No acute distress, well-developed, well-groomed., portuguese interpreter, Gislene, weight stable, VSS.    Eye:  Normal conjunctiva.    HENT:  Oral mucosa is moist, No pharyngeal erythema, No sinus tenderness.    Neck:  Supple, Non-tender, No lymphadenopathy.    Respiratory:  Lungs are clear to auscultation, Respirations are non-labored, Breath sounds are equal, Symmetrical chest wall expansion.     Cardiovascular:  Regular rhythm, No gallop, Good pulses equal in all extremities, Normal peripheral perfusion, No edema, soft 2/6 murmur noted, irregular HR.    Gastrointestinal:  Soft, Non-tender, Normal bowel sounds.    Lymphatics:  No lymphadenopathy neck, axilla, groin.    Musculoskeletal     Normal range of motion.     Normal gait.     no spinal tenderness.     Integumentary:  Warm, Dry, Intact, No pallor, No rash.    Neurologic:  Cranial Nerves II-XII are grossly intact.    Cognition and Speech:  Speech clear and coherent, Functional cognition intact.    Psychiatric:  Cooperative, Appropriate mood & affect, Normal judgment.       Review / Management   Results review:  Lab results   08/14/2016 10:47  WBC 5.6 thous/mm3    RBC 4.80 Mil/mm3    Hgb 16.3 Gm/dL    Hct 81.1 %    Platelet 129 thous/mm3  LOW    MCV 98 fL  HI    MCH 34 pGm  HI    MCHC 34.5 Gm/dL    RDW 91.4 %    MPV 78.2 fL    Neutrophils 52 %    Lymphocytes 31 %    Monocytes 14 %  HI    Eosinophils 2 %    Basophils 1 %    Immature Granulocytes 0 %    NRBC Percent 0 %    Absolute Neutro Count 2,930 /mm3  NA   .       Impression and Plan   61 year old male referred to the Hematology Clinic on 04/02/14 for evaluation of thrombocytopenia.  Review of his labs shows persistent platelet counts in the low 100's.  Workup is negative for an autoimmune etiology (RA factor and ANA are negative) and liver shows slightly heterogeneous echotexture suggesting diffuse parenchymal disease otherwise no other abnormality noted with negative hepatitis panel and normal LFTs, and splenic size is normal.  There is a slight abnormality noted in his cardiolipin IgM in 04/02/14 when it was moderately high at 15. Repeat in 08/2014 was normal at 12.  Abdominal US done  on 04/13/14 shows slightly heterogeneous hepatic echotexture suggesting diffuse parenchymal disease. No evidence of hepatosplenomegaly.  Atherosclerotic disease of the aorta.    Pt is well appearing with his only  complaint being a slight increase in his level of fatigue and mild nausea in the AM (?with his medications).  No recent labs.  Pt continues to take ASA only, plavix was stopped by his cardiologist 4-5 months ago based on the pt's recollection.  Last annual abd. US done on 08/07/16 shows mildly increased echogenicity of his liver.  Spleen size is within normal range.  No s&s of bleeding.  Pt states he drinks 1-2 glasses of wine per week.  He is still not smoking.  Next clinic visit in 6 months with repeat labs.  Pt will get his labs drawn today.  Reported he forgot to get them drawn prior to his appt.    Plan   -  Repeat labs today  - Next clinic visit in 6 months with repeat labs  - Continue close follow up with his cardiologist, every 6 months.  - Call office if he experiences any s&s of bleeding.  - Repeat US in 1 year - due 07/2017  - Maintain plt count > 50K due to treatment with plavix and ASA  - cc note to Dr. Illene Bolus, Dr. Morley Kos, Dr. Gerilyn Nestle    Pt encouraged to call with any questions or concerns.  Pt understands and concurs with the treatment plan.          Reviewed and electronically verified by:  Albin Fischer                                                                       on:  08/14/2016 11:08    Reviewed and electronically authenticated by:  Conni Slipper                                                                         on:  08/14/16 11:08

## 2016-09-22 ENCOUNTER — Ambulatory Visit: Admitting: Cardiovascular Disease

## 2016-09-22 ENCOUNTER — Ambulatory Visit

## 2016-09-22 ENCOUNTER — Ambulatory Visit (HOSPITAL_BASED_OUTPATIENT_CLINIC_OR_DEPARTMENT_OTHER)

## 2016-09-22 NOTE — Progress Notes (Signed)
* * *         [facility]**        ---    Lawernce Ion. Sindy Messing, MD [facility] Kieth Brightly. Donnetta Simpers, MD [facility] Barbera Setters Byrd Hesselbach, MD  [facility];    Darlyn Chamber, MD [facility] Roosvelt Maser, MD [facility] Shanon Brow, MD [facility]  Nile Dear. Audley Hose, MD [facility]    Kandis Mannan A. Karie Mainland, MD [facility] Johnell Comings. MacNaught, MD [facility] Mitchel Honour, MD Erma Lemay, MD [facility]    Colletta Maryland, MD Weston Anna, MD [facility] Kerby Moors, NP Maximino Greenland ,  NP        * * *     **Patient Name:** [name]   **Date:** 09/22/2016    --- ---     **DOB:** Dec 08, 1955     **Referring Provider:** Dorita Sciara, MD   **Appointment Provider:** Trecia Rogers, MD        * * *    09/22/2016  Progress Notes: Trecia Rogers, MD    --- ---    ---        Current Medications    ---    Taking     * clopidogrel 75 mg Tablet 1 tab(s) orally once a day    ---    * Crestor 20 mg tablet 1 tab(s) orally once a day (at bedtime)    ---    * lisinopril-hctz 12.5 mg-20 mg tablet 1 tab(s) orally once a day    ---    * carvedilol 6.25 mg tablet 1 tab(s) orally 2 times a day    ---    * Aspirin 81 mg 1 tablet once daily    ---    * citalopram 20 mg tablet 1 tab(s) orally once a day    ---    * cyclobenzaprine 10 mg tablet 1 tab(s) orally Q.D.    ---    * Vitamin D3 50,000 intl units capsule 1 cap(s) orally once a wk    ---    * Vitamin B12 1000 mcg tablet 1 tab(s) orally once a day    ---    * meclizine 25 mg tablet 1 tab(s) orally 3 times a day prn    ---    * Medication List reviewed and reconciled with the patient    ---      Past Medical History    ---      CAD --> 90% OMB1 s/p DES (Promus 2x5 x 20) and 100% pRCA s/p DES(Promus 2x5  x 32), diffuse disease otherwise    ---    CM with LVEF 35% by cath and 45% by echo 11/15    ---    Hyperlipidemia    ---    Depression    ---    Vertigo    ---    ETOH in past    ---    Prior Smoker    ---    Denies hx of HTN, DM, CVA, TIA, MI, asthma, or GI bleeds    ---    Thrombocytopenia (low 100s), seen by  hematologist [facility] 2/16    ---      Family History    ---      Father: deceased 24 yrs    ---    Mother: deceased 64 yrs    ---    Siblings: alive, diagnosed with Heart Disease    ---    All brothers (4) with heart  diease -    Oldest brother had CABG    Middle brother with PTCI    younger brother PPM.    ---      Social History    ---    Tobacco Status: former smoker Quit 7/17, How long has it been since you last  smoked? 6-12 months, Patient counseled on the dangers of tobacco use:  09/22/2016.    Advised to lose weight: Yes .    No Presence of Falls .    ETOH: Prior heavy drinker; now not drinking (1 year or so).    no Recreational drug use.   Married; 4 chidren; workes as Administrator, arts;  originally from [place]; To retire next month and plans to move to [place] in 3/18.    ---      Allergies    ---      atorvastatin: myalgias with 20 mg qd dose: Side Effects    ---        Reason for Appointment    ---      1\. F/u CAD, LV dysfunction s/p 2 vessel PTCI, EF 35-45%, HTN, dyslipidemia    ---    2\. Cigarette abuse    ---      History of Present Illness    ---     _HPI_ :    61 year old presents today for followup and [employer] interpreter services used to  communicate with him    Coronary artery disease s/p PTCI RCA and OMB (drug-eluting stents) 11/15:  Feels well and no chest, arm or jaw symptoms and fleeting chest symptoms  desribed at last visit resolved. Can 1 mile without difficulty.    LV systolic dysfunction and ischemic cardiomyopathy: Denies shortness of  breath with current level of activity and no orthopnea or PND. He is somewhat  careful with salt in his diet and denies palpitations, dizzy spells or syncope    Hyperlipidemia: Developed myalgias related to atorvastatin but tolerating  crestor without myalgias    Cigar/Cigarette abuse: Quit 7 months ago and remains smoke free.      Vital Signs    ---    HR 66, BP 110/86, Wt 174, Ht 68, BMI 26.45, Med Assist: AN.      Past Orders    ---     _Lab:Lipid  Profile (Order Date - 03/16/2016) (Collection Date - 03/20/2016)_    Status  Fasting     \-    Chol  148    140-200 - mg/dL    HDL  55    47-82 - mg/dL    Trig  956    2-130 - mg/dL    LDL  70    8-657 - mg/dL      Examination    ---     _Reveals_ :    General appearance Well appearing man in NAD . HEENT Normocephalic,  atraumatic, Sclera anicteric, oropharynx clear. Neck exam No JVD/bruits,  Trachea in midline. Chest Symetrical to expansion . Lungs clear to  auscultation . Cardiac exam Nondisplaced PMI, Regular heart sounds without S3,  S4, murmurs or rubs, No heaves or thrills . Abdomen Soft, nondistended,  nontender with normal bowel sounds, No bruits or pulsatile masses. Extremity  No edema, clubbing or cyanosis, pulses 2+. Neuro Grossly nonfocal motor exam.  Skin warm and dry without rashes. Psych Mood is appropriate; alert and  oriented times 3.          Data    ---  _EKG (reviewed personally)_ :    6/17: NSR @ 61, OIMI and IVCD, Delayed R wave progression, minor nonspecific  lateral ST changes, mildly prolonged QT/QTc - similar to prior .    _Echo_ :    8/17: LVEF 45%, severe hypokinesis of inferior wall and inferoseptaum a/w  trace to mild MR; C/w prior echo, LVEF similar and MR less notable.    _Ankle Brachial Index_ :    12/16: Normal (1.2 R and 1.3 L).    _Lexiscan Myoview_ :    5/17 [facility]: LVEF 31% with inferior akinesis; inferior scar and mild peri-infarct  ischemia .    _Cardiac catheterization_ :    11/15: EF 35%, mild MR, diffuse CAD, pLAD 30%, mLAD 20 - 30%, pRCA 100% s/p  PTCI (2.5x32 Promus) DES and OMB1 90% stenosis s/p PTCI (2.5x 20 Promus) DES.          Impression/Recommendations    ---       **1\. Native vessel CAD without anginal symptoms**    Stop clopidogrel Tablet, 75 mg, 1 tab(s), orally, once a day, 90 days, 90    Clinical Notes: 2 vessel coronary disease with occluded right coronary artery  and tight OMB stenosis in setting of ACS, s/p PTCI of both lesions with drug-  eluting stents  11/15. He has had no anginal symptoms since that point and is  doing quite well at this point and can walk a mile without difficulty. Stress  test 5/17 reviewed with him notable for moderate sized inferior scar and mild  amount of peri-infact ischemia and based on findings and absence of symptoms,  medical therapy would be optimal strategy. To call for any exertional symptoms  and 911 for persistent symptoms > 20 min. In terms of dual antiplatelet  therapy, he is greater than 2 years post PTCI and quit smoking 7 months ago  and at this point, it would be reasonable to discontinue Plavix and continue  with aspirin 81 mg daily.    ---        **2\. Essential Hypertension**    Notes: Patient Educated with: [email or web address].pdf ([email or web address].pdf).    Clinical Notes: Well controlled on current regimen.        **3\. Left ventricular failure**    Refill carvedilol tablet, 6.25 mg, 1 tab(s), orally, 2 times a day, 30 day(s),  60, Refills 11    Clinical Notes: NYHA II symptoms without exam evidence for CHF and repeat Echo  with LVEF 45%. He is on a reasonable medical regimen and we discussed  ingestive heart failure and importance of salt restriction and we discussed  small risk for potentially fatal arrhythmias and to report any new symptoms to  me or his new cardiologist once he moved to [place].        **4\. Hypercholesterolemia**    Refill Crestor tablet, 20 mg, 1 tab(s), orally, once a day (at bedtime), 90  days, 90 tablets, Refills 3    Notes: Patient Educated with: [email or web address].pdf ([email or web address].pdf).    Clinical Notes: Atorvastatin related myalgias for tolerating rosuvastatin 20  mg daily and to continue for preventive efforts.        **5\. S/p PTCI**    Clinical Notes: plan to discontinue Plavix as above.        **6\. h/o Cigarette abuse; currently in remission**    Clinical Notes: Congratulated on smoke cessation and benefits reviewed with  him again.        **7\. Others**    Refill  lisinopril-hctz tablet, 12.5 mg-20 mg, 1 tab(s), orally, once a day, 90  days, 90, Refills 3      Follow Up    ---    prn    Electronically signed by Darlyn Chamber MD on 09/22/2016 at 07:10 PM EST    Sign off status: Completed        * * *        [facility]    [address]    [place], [place] [address]    Tel: [phone]    Fax: [phone]              * * *          Patient: VANSH, RECKART DOB: [birth year] Progress Note: Trecia Rogers, MD  09/22/2016    ---    Note generated by eClinicalWorks EMR/PM Software ([email or web address])

## 2019-12-08 ENCOUNTER — Ambulatory Visit: Payer: Self-pay | Admitting: Family Medicine

## 2020-01-09 ENCOUNTER — Emergency Department: Payer: Medicare Other

## 2020-01-09 ENCOUNTER — Encounter: Payer: Self-pay | Admitting: Emergency Medicine

## 2020-01-09 ENCOUNTER — Emergency Department
Admission: EM | Admit: 2020-01-09 | Discharge: 2020-01-09 | Disposition: A | Discharge status: Home / Self Care | Payer: Medicare Other | Attending: Emergency Medicine | Admitting: Emergency Medicine

## 2020-01-09 ENCOUNTER — Other Ambulatory Visit: Payer: Self-pay

## 2020-01-09 DIAGNOSIS — Z87891 Personal history of nicotine dependence: Secondary | ICD-10-CM | POA: Diagnosis not present

## 2020-01-09 DIAGNOSIS — J40 Bronchitis, not specified as acute or chronic: Secondary | ICD-10-CM | POA: Insufficient documentation

## 2020-01-09 DIAGNOSIS — I10 Essential (primary) hypertension: Secondary | ICD-10-CM | POA: Insufficient documentation

## 2020-01-09 DIAGNOSIS — R0789 Other chest pain: Secondary | ICD-10-CM | POA: Insufficient documentation

## 2020-01-09 HISTORY — DX: Acute myocardial infarction, unspecified: I21.9

## 2020-01-09 HISTORY — DX: Unspecified osteoarthritis, unspecified site: M19.90

## 2020-01-09 HISTORY — DX: Essential (primary) hypertension: I10

## 2020-01-09 LAB — CBC
HCT: 46.8 % (ref 39.0–52.0)
Hemoglobin: 16.4 g/dL (ref 13.0–17.0)
MCH: 35 pg — ABNORMAL HIGH (ref 26.0–34.0)
MCHC: 35 g/dL (ref 30.0–36.0)
MCV: 100 fL (ref 80.0–100.0)
Platelets: 141 10*3/uL — ABNORMAL LOW (ref 150–400)
RBC: 4.68 MIL/uL (ref 4.22–5.81)
RDW: 13.5 % (ref 11.5–15.5)
WBC: 6.6 10*3/uL (ref 4.0–10.5)
nRBC: 0 % (ref 0.0–0.2)

## 2020-01-09 LAB — TROPONIN I (HIGH SENSITIVITY)
Troponin I (High Sensitivity): 7 ng/L (ref <18)
Troponin I (High Sensitivity): 7 ng/L (ref <18)
Troponin I (High Sensitivity): 10 ng/L (ref <18)

## 2020-01-09 LAB — BASIC METABOLIC PANEL
Anion gap: 10 (ref 5–15)
BUN: 6 mg/dL — ABNORMAL LOW (ref 8–23)
CO2: 20 mmol/L — ABNORMAL LOW (ref 22–32)
Calcium: 8.9 mg/dL (ref 8.9–10.3)
Chloride: 107 mmol/L (ref 98–111)
Creatinine, Ser: 0.7 mg/dL (ref 0.61–1.24)
GFR calc Af Amer: >60 mL/min (ref >60)
GFR calc non Af Amer: >60 mL/min (ref >60)
Glucose, Bld: 99 mg/dL (ref 70–99)
Potassium: 3.6 mmol/L (ref 3.5–5.1)
Sodium: 137 mmol/L (ref 135–145)

## 2020-01-09 IMAGING — CR DG CHEST 2V
2 series · 2 of 2 positions shown · non-contrast
Comparison: None.

CLINICAL DATA: Generalized chest pain. Progressive pain over the
past 3. Cough. Nausea.

EXAM:
CHEST - 2 VIEW

[chest pa]
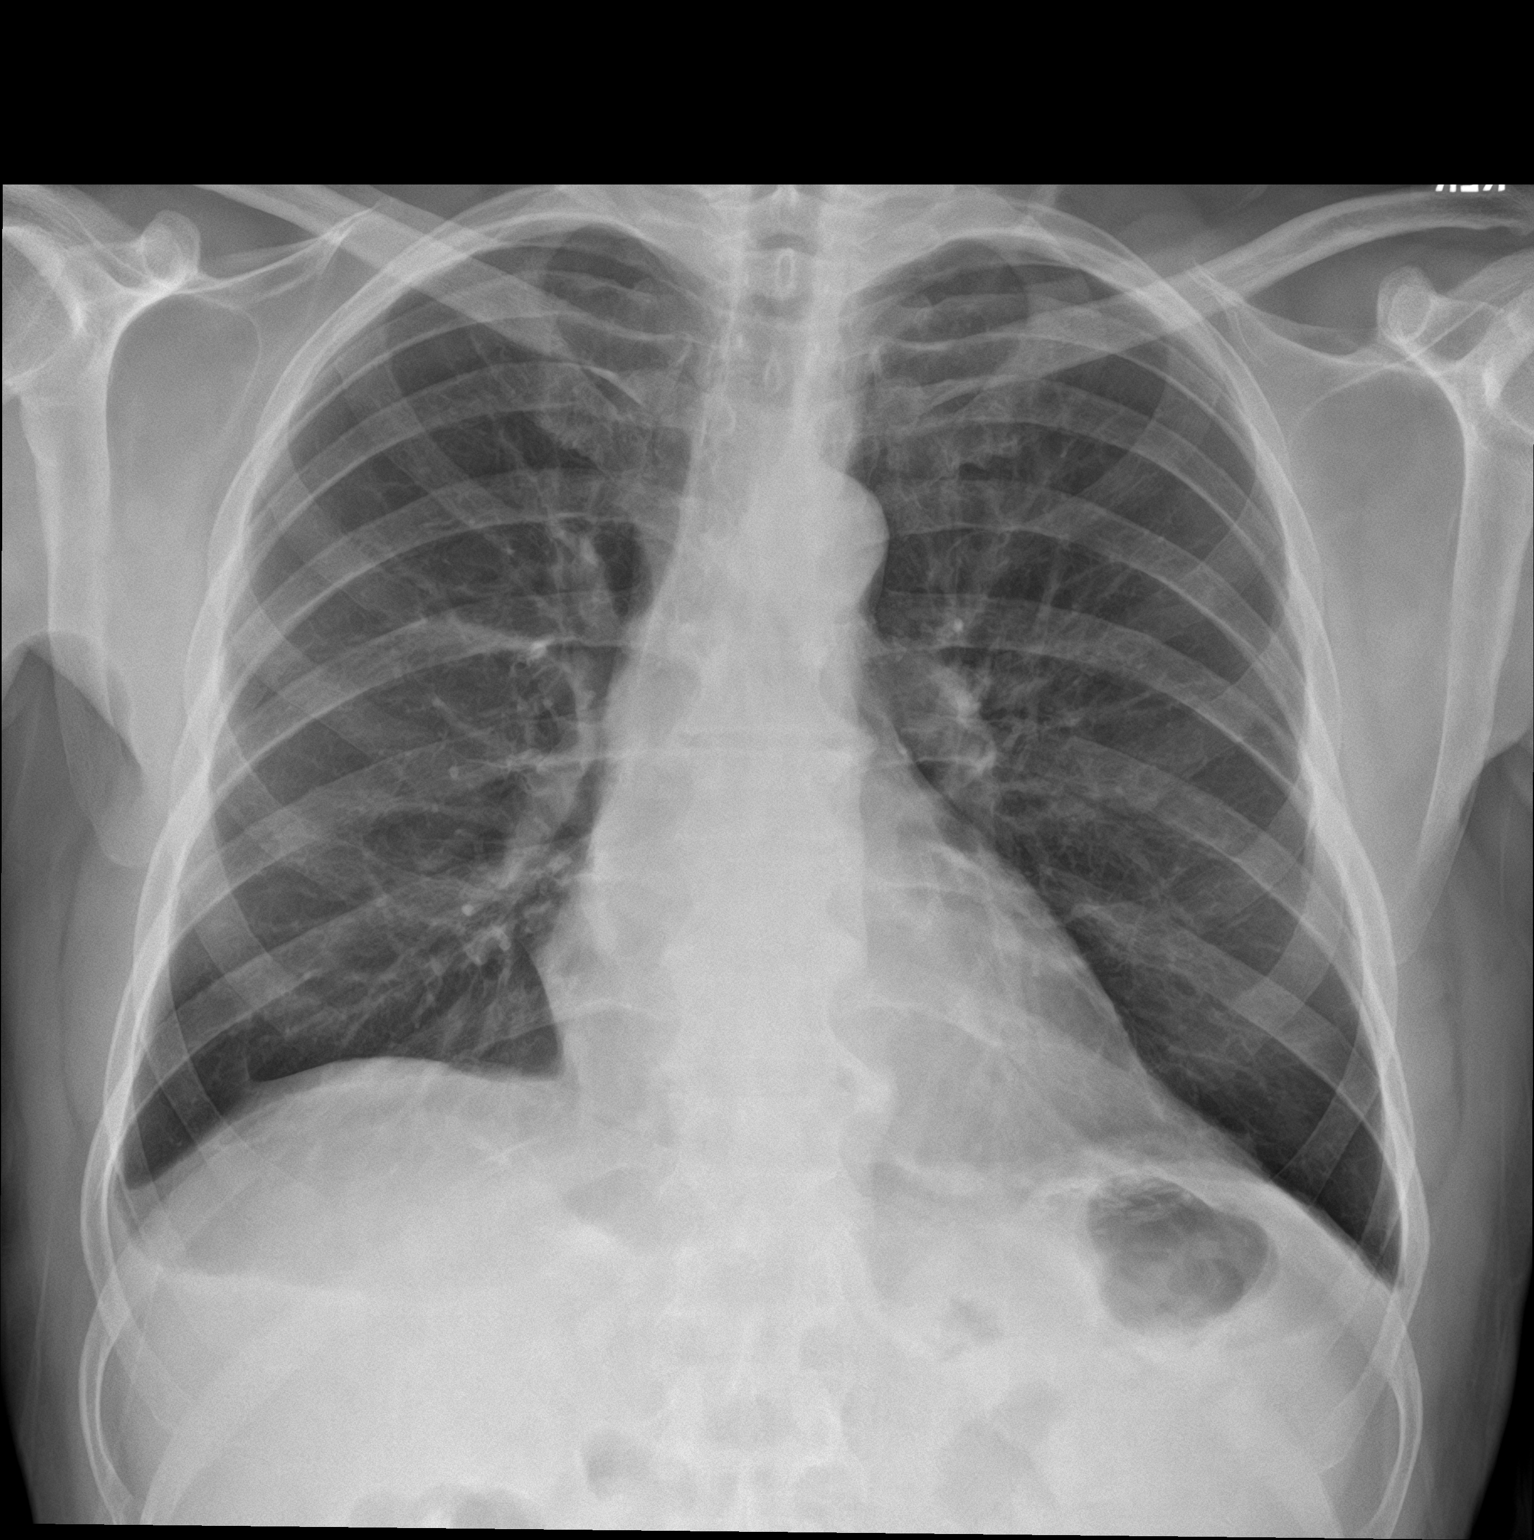

[chest lat]
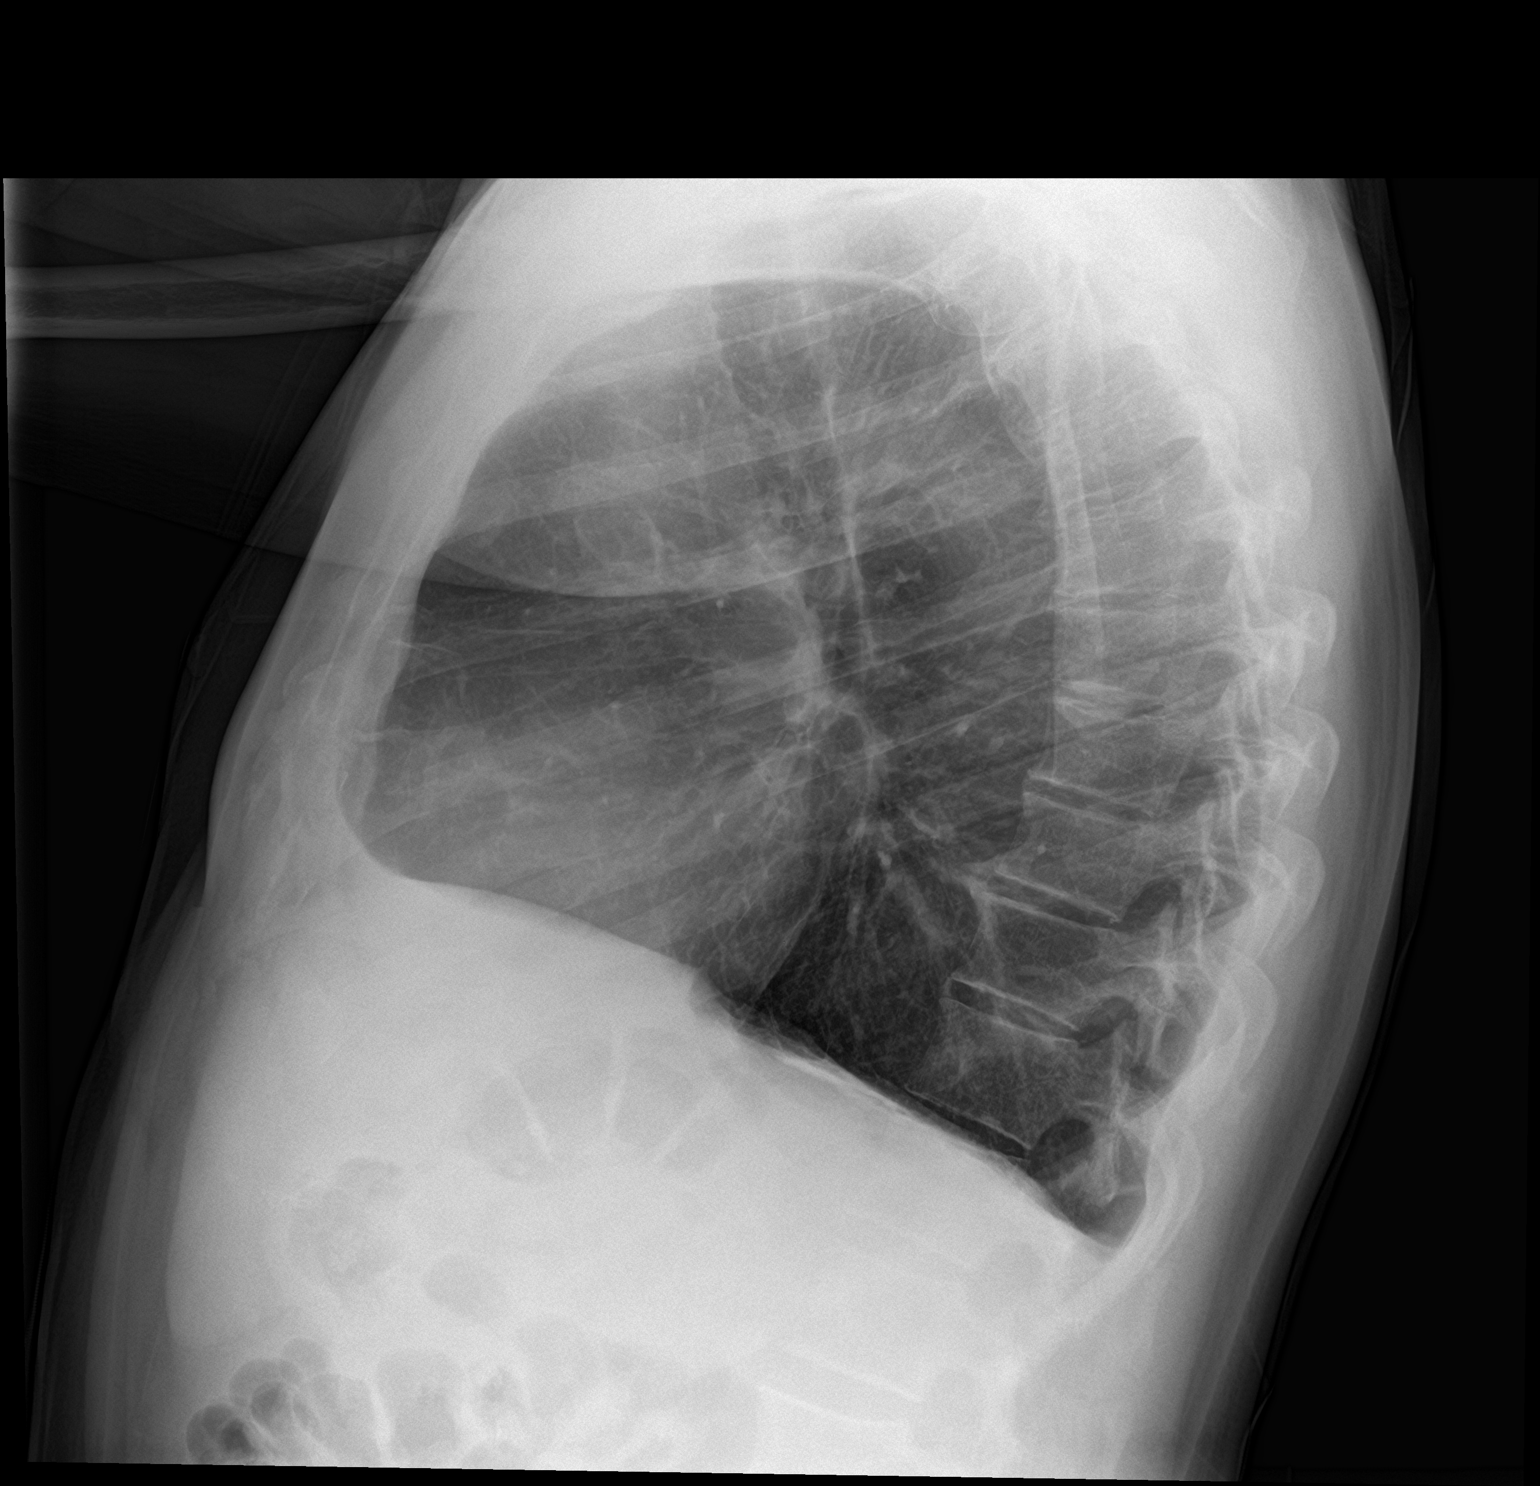

[2 of 2 positions shown; findings below may reference images not displayed]

FINDINGS: The cardiomediastinal contours are normal. Mild hyperinflation with
blunting of the costophrenic angles, favor to be related to
hyperinflation rather than small effusions. Pulmonary vasculature is
normal. No consolidation or pneumothorax. No acute osseous
abnormalities are seen.
IMPRESSION: Mild hyperinflation. Blunting of the costophrenic angles favored to
be related to hyperinflation rather than small effusions.

## 2020-01-09 MED ORDER — IOHEXOL 350 MG/ML SOLN
75.0000 mL | Freq: Once | INTRAVENOUS | Status: AC | PRN
  Administered 2020-01-09: 75 mL via INTRAVENOUS

## 2020-01-09 MED ORDER — LISINOPRIL 20 MG PO TABS
20.0000 mg | ORAL_TABLET | Freq: Every day | ORAL | 2 refills | Status: DC
Start: 2020-01-09 — End: 2020-06-16

## 2020-01-09 MED ORDER — LISINOPRIL 10 MG PO TABS
20.0000 mg | ORAL_TABLET | Freq: Once | ORAL | Status: AC
  Administered 2020-01-09: 20 mg via ORAL
  Filled 2020-01-09: qty 2

## 2020-01-09 MED ORDER — PREDNISONE 20 MG PO TABS: 40.0000 mg | ORAL_TABLET | Freq: Every day | ORAL | 0 refills | Status: DC

## 2020-01-09 MED ORDER — LISINOPRIL 20 MG PO TABS
20.0000 mg | ORAL_TABLET | Freq: Every day | ORAL | 2 refills | Status: DC
Start: 2020-01-09 — End: 2020-01-09

## 2020-01-09 MED ORDER — METHYLPREDNISOLONE SODIUM SUCC 125 MG IJ SOLR
125.0000 mg | Freq: Once | INTRAMUSCULAR | Status: AC
  Administered 2020-01-09: 125 mg via INTRAVENOUS
  Filled 2020-01-09: qty 2

## 2020-01-09 MED ORDER — SODIUM CHLORIDE 0.9 % IV BOLUS
500.0000 mL | Freq: Once | INTRAVENOUS | Status: AC
  Administered 2020-01-09: 500 mL via INTRAVENOUS

## 2020-01-09 MED ORDER — AZITHROMYCIN 250 MG PO TABS
ORAL_TABLET | ORAL | 0 refills | Status: DC
Start: 2020-01-09 — End: 2020-06-16

## 2020-01-09 MED ORDER — ALBUTEROL SULFATE HFA 108 (90 BASE) MCG/ACT IN AERS
2.0000 | INHALATION_SPRAY | RESPIRATORY_TRACT | 0 refills | Status: DC | PRN
Start: 2020-01-09 — End: 2020-07-29

## 2020-01-09 MED ORDER — NAPROXEN 500 MG PO TABS
500.0000 mg | ORAL_TABLET | Freq: Two times a day (BID) | ORAL | 0 refills | Status: DC
Start: 2020-01-09 — End: 2020-07-20

## 2020-01-09 NOTE — ED Triage Notes (Signed)
Patient to ER for c/o chest pain (generalized) and back pain that began 20 days ago. Patient reports pain has become more constant and worsens with activity. +Cough x15 days. Denies any fevers. Denies diaphoresis. +Nausea. Patient has h/o MI with 100% blockage in 2016. Reports feels somewhat similar to previous MI (has h/o of MI x2).

## 2020-01-09 NOTE — Discharge Instructions (Addendum)
Your lab tests and CT scan were all normal.  Your symptoms are most likely due to lung inflammation, which can be treated by prednisone (a steroid), albuterol inhaler, azithromycin (antibiotic), and naproxen (anti-inflammatory).

## 2020-01-09 NOTE — ED Provider Notes (Signed)
[facility] Emergency Department Provider Note  ____________________________________________  Time seen: Approximately 7:36 PM  I have reviewed the triage vital signs and the nursing notes.   HISTORY  Chief Complaint Chest Pain  Encounter completed with [place] video interpreter  HPI Marcus Gray is a 64 y.o. male with a past history of MI who comes to the ED complaining of chest pain that feels sharp, radiates to back, worse with cough and deep breathing.  Associated with shortness of breath, no diaphoresis or vomiting.  Not exertional.  Started after a trip to [place] about 3 weeks ago.  Has been constant since then.  Moderate intensity.  Denies recent trauma hospitalization or surgery, denies history of DVT or PE.      Past Medical History:  Diagnosis Date  . Arthritis   . Hypertension   . MI (myocardial infarction) ([place])      There are no problems to display for this patient.    Past Surgical History:  Procedure Laterality Date  . CORONARY ANGIOPLASTY WITH STENT PLACEMENT       Prior to Admission medications   Medication Sig Start Date End Date Taking? Authorizing Provider  albuterol (PROVENTIL HFA) 108 (90 Base) MCG/ACT inhaler Inhale 2 puffs into the lungs every 4 (four) hours as needed for wheezing or shortness of breath. 01/09/20   Carrie Mew, MD  azithromycin (ZITHROMAX Z-PAK) 250 MG tablet Take 2 tablets (500 mg) on  Day 1,  followed by 1 tablet (250 mg) once daily on Days 2 through 5. 01/09/20   Carrie Mew, MD  naproxen (NAPROSYN) 500 MG tablet Take 1 tablet (500 mg total) by mouth 2 (two) times daily with a meal. 01/09/20   Carrie Mew, MD  predniSONE (DELTASONE) 20 MG tablet Take 2 tablets (40 mg total) by mouth daily. 01/09/20   Carrie Mew, MD     Allergies Patient has no known allergies.   No family history on file.  Social History Social History   Tobacco Use  . Smoking status: Former  Research scientist (life sciences)  . Smokeless tobacco: Never Used  Substance Use Topics  . Alcohol use: Yes    Comment: wine at night  . Drug use: Not on file  Total of about 25-year smoking history including 20 years in his youth and resumed smoking the past 4 years.  Review of Systems  Constitutional:   No fever or chills.  ENT:   No sore throat. No rhinorrhea. Cardiovascular: Positive chest pain as above without syncope. Respiratory: Positive shortness of breath without cough. Gastrointestinal:   Negative for abdominal pain, vomiting and diarrhea.  Musculoskeletal:   Negative for focal pain or swelling All other systems reviewed and are negative except as documented above in ROS and HPI.  ____________________________________________   PHYSICAL EXAM:  VITAL SIGNS: ED Triage Vitals  Enc Vitals Group     BP 01/09/20 1402 (!) 176/111     Pulse Rate 01/09/20 1402 (!) 59     Resp 01/09/20 1402 20     Temp 01/09/20 1402 98.6 F (37 C)     Temp Source 01/09/20 1402 Oral     SpO2 01/09/20 1402 97 %     Weight 01/09/20 1355 174 lb 2.6 oz (79 kg)     Height 01/09/20 1355 5' 7.72" (1.72 m)     Head Circumference --      Peak Flow --      Pain Score 01/09/20 1355 7     Pain Loc --  Pain Edu? --      Excl. in [place]? --     Vital signs reviewed, nursing assessments reviewed.   Constitutional:   Alert and oriented. Non-toxic appearance. Eyes:   Conjunctivae are normal. EOMI. PERRL. ENT      Head:   Normocephalic and atraumatic.      Nose:   Wearing a mask.      Mouth/Throat:   Wearing a mask.      Neck:   No meningismus. Full ROM. Hematological/Lymphatic/Immunilogical:   No cervical lymphadenopathy. Cardiovascular:   RRR. Symmetric bilateral radial and DP pulses.  No murmurs. Cap refill less than 2 seconds. Respiratory:   Normal respiratory effort without tachypnea/retractions. Breath sounds are clear and equal bilaterally. No wheezes/rales/rhonchi.  Slightly prolonged expiratory  phase. Gastrointestinal:   Soft and nontender. Non distended. There is no CVA tenderness.  No rebound, rigidity, or guarding. Musculoskeletal:   Normal range of motion in all extremities. No joint effusions.  No lower extremity tenderness.  No edema. Neurologic:   Normal speech and language.  Motor grossly intact. No acute focal neurologic deficits are appreciated.  Skin:    Skin is warm, dry and intact. No rash noted.  No petechiae, purpura, or bullae.  ____________________________________________    LABS (pertinent positives/negatives) (all labs ordered are listed, but only abnormal results are displayed) Labs Reviewed  BASIC METABOLIC PANEL - Abnormal; Notable for the following components:      Result Value   CO2 20 (*)    BUN 6 (*)    All other components within normal limits  CBC - Abnormal; Notable for the following components:   MCH 35.0 (*)    Platelets 141 (*)    All other components within normal limits  TROPONIN I (HIGH SENSITIVITY)  TROPONIN I (HIGH SENSITIVITY)  TROPONIN I (HIGH SENSITIVITY)   ____________________________________________   EKG  Interpreted by me Normal sinus rhythm rate of 69, normal axis and intervals.  Poor R wave progression.  Voltage material for LVH in the high lateral leads.  Normal ST segments.  Unremarkable T waves.  ____________________________________________    RADIOLOGY  DG Chest 2 View  Result Date: 01/09/2020 CLINICAL DATA:  Generalized chest pain. Progressive pain over the past 3. Cough. Nausea. EXAM: CHEST - 2 VIEW COMPARISON:  None. FINDINGS: The cardiomediastinal contours are normal. Mild hyperinflation with blunting of the costophrenic angles, favor to be related to hyperinflation rather than small effusions. Pulmonary vasculature is normal. No consolidation or pneumothorax. No acute osseous abnormalities are seen. IMPRESSION: Mild hyperinflation. Blunting of the costophrenic angles favored to be related to hyperinflation  rather than small effusions. Electronically Signed   By: Keith Rake M.D.   On: 01/09/2020 15:08   CT Angio Chest PE W and/or Wo Contrast  Result Date: 01/09/2020 CLINICAL DATA:  Shortness of breath. Concern for pulmonary embolism. EXAM: CT ANGIOGRAPHY CHEST WITH CONTRAST TECHNIQUE: Multidetector CT imaging of the chest was performed using the standard protocol during bolus administration of intravenous contrast. Multiplanar CT image reconstructions and MIPs were obtained to evaluate the vascular anatomy. CONTRAST:  41mL OMNIPAQUE IOHEXOL 350 MG/ML SOLN COMPARISON:  None. FINDINGS: Cardiovascular: Contrast injection is sufficient to demonstrate satisfactory opacification of the pulmonary arteries to the segmental level. There is no pulmonary embolus or evidence of right heart strain. The size of the main pulmonary artery is normal. Heart size is normal, with no pericardial effusion. The course and caliber of the aorta are normal. There is no atherosclerotic calcification. Opacification  decreased due to pulmonary arterial phase contrast bolus timing. Mediastinum/Nodes: -- No mediastinal lymphadenopathy. -- No hilar lymphadenopathy. -- No axillary lymphadenopathy. -- No supraclavicular lymphadenopathy. -- Normal thyroid gland where visualized. -  Unremarkable esophagus. Lungs/Pleura: There are calcified pleural based plaques at the lung bases bilaterally. Upper Abdomen: Contrast bolus timing is not optimized for evaluation of the abdominal organs. The visualized portions of the organs of the upper abdomen are normal. Musculoskeletal: No chest wall abnormality. No bony spinal canal stenosis. Review of the MIP images confirms the above findings. IMPRESSION: 1. No evidence of pulmonary embolus or other acute intrathoracic process. 2. Calcified pleural based plaques at the lung bases bilaterally, which can be seen in patients with prior asbestos exposure. Electronically Signed   By: Constance Holster M.D.   On:  01/09/2020 20:21    ____________________________________________   PROCEDURES Procedures  ____________________________________________  DIFFERENTIAL DIAGNOSIS   Non-STEMI, pulmonary embolism, COPD exacerbation/bronchitis, atypical pneumonia  CLINICAL IMPRESSION / ASSESSMENT AND PLAN / ED COURSE  Medications ordered in the ED: Medications  sodium chloride 0.9 % bolus 500 mL (500 mLs Intravenous New Bag/Given 01/09/20 1944)  methylPREDNISolone sodium succinate (SOLU-MEDROL) 125 mg/2 mL injection 125 mg (125 mg Intravenous Given 01/09/20 1945)  iohexol (OMNIPAQUE) 350 MG/ML injection 75 mL (75 mLs Intravenous Contrast Given 01/09/20 2008)    Pertinent labs & imaging results that were available during my care of the patient were reviewed by me and considered in my medical decision making (see chart for details).  Kiel was evaluated in Emergency Department on 01/09/2020 for the symptoms described in the history of present illness. He was evaluated in the context of the global COVID-19 pandemic, which necessitated consideration that the patient might be at risk for infection with the SARS-CoV-2 virus that causes COVID-19. Institutional protocols and algorithms that pertain to the evaluation of patients at risk for COVID-19 are in a state of rapid change based on information released by regulatory bodies including the CDC and federal and state organizations. These policies and algorithms were followed during the patient's care in the ED.   Patient presents with pleuritic chest pain and cough for the past 3 weeks.  Highly suspicious for PE despite unremarkable vital signs, and will need a CT angiogram of the chest to evaluate.  Initial chest x-ray EKG and labs unremarkable, will get a second troponin as well for trending although the first 1 was normal.  I will give the patient a dose of Solu-Medrol, since symptoms are most likely due to COPD if delta troponin and CT chest are  unremarkable.     ----------------------------------------- 9:43 PM on 01/09/2020 -----------------------------------------  Troponin remains normal.  CT angiogram unremarkable without any acute findings.  Most likely bronchitis from underlying COPD due to his long smoking history.  Will treat with prednisone, albuterol, azithromycin, naproxen.  Follow-up with primary care.  ____________________________________________   FINAL CLINICAL IMPRESSION(S) / ED DIAGNOSES    Final diagnoses:  Atypical chest pain  Bronchitis     ED Discharge Orders         Ordered    predniSONE (DELTASONE) 20 MG tablet  Daily     Discontinue  Reprint     01/09/20 2142    albuterol (PROVENTIL HFA) 108 (90 Base) MCG/ACT inhaler  Every 4 hours PRN     Discontinue  Reprint     01/09/20 2142    azithromycin (ZITHROMAX Z-PAK) 250 MG tablet     Discontinue  Reprint  01/09/20 2142    naproxen (NAPROSYN) 500 MG tablet  2 times daily with meals     Discontinue  Reprint     01/09/20 2142          Portions of this note were generated with dragon dictation software. Dictation errors may occur despite best attempts at proofreading.   Carrie Mew, MD 01/09/20 [phone]

## 2020-04-20 ENCOUNTER — Ambulatory Visit (INDEPENDENT_AMBULATORY_CARE_PROVIDER_SITE_OTHER): Payer: Medicare Other | Admitting: Family Medicine

## 2020-04-20 ENCOUNTER — Encounter: Payer: Self-pay | Admitting: Emergency Medicine

## 2020-04-20 ENCOUNTER — Encounter: Payer: Self-pay | Admitting: Family Medicine

## 2020-04-20 VITALS — BP 158/100 | HR 86 | Temp 98.2°F | Resp 16 | Ht 67.5 in | Wt 175.4 lb

## 2020-04-20 DIAGNOSIS — I10 Essential (primary) hypertension: Secondary | ICD-10-CM

## 2020-04-20 DIAGNOSIS — I252 Old myocardial infarction: Secondary | ICD-10-CM

## 2020-04-20 DIAGNOSIS — Z720 Tobacco use: Secondary | ICD-10-CM

## 2020-04-20 DIAGNOSIS — M109 Gout, unspecified: Secondary | ICD-10-CM

## 2020-04-20 DIAGNOSIS — Z131 Encounter for screening for diabetes mellitus: Secondary | ICD-10-CM

## 2020-04-20 DIAGNOSIS — Z23 Encounter for immunization: Secondary | ICD-10-CM | POA: Diagnosis not present

## 2020-04-20 DIAGNOSIS — R634 Abnormal weight loss: Secondary | ICD-10-CM

## 2020-04-20 DIAGNOSIS — F331 Major depressive disorder, recurrent, moderate: Secondary | ICD-10-CM

## 2020-04-20 DIAGNOSIS — Z1159 Encounter for screening for other viral diseases: Secondary | ICD-10-CM

## 2020-04-20 DIAGNOSIS — I2510 Atherosclerotic heart disease of native coronary artery without angina pectoris: Secondary | ICD-10-CM

## 2020-04-20 DIAGNOSIS — R5383 Other fatigue: Secondary | ICD-10-CM

## 2020-04-20 DIAGNOSIS — Z1211 Encounter for screening for malignant neoplasm of colon: Secondary | ICD-10-CM

## 2020-04-20 LAB — COMPLETE METABOLIC PANEL WITH GFR
AG Ratio: 1.7 (calc) (ref 1.0–2.5)
ALT: 28 U/L (ref 9–46)
AST: 21 U/L (ref 10–35)
Albumin: 4.4 g/dL (ref 3.6–5.1)
Alkaline phosphatase (APISO): 72 U/L (ref 35–144)
BUN: 8 mg/dL (ref 7–25)
CO2: 28 mmol/L (ref 20–32)
Calcium: 9.1 mg/dL (ref 8.6–10.3)
Chloride: 106 mmol/L (ref 98–110)
Creat: 0.9 mg/dL (ref 0.70–1.25)
GFR, Est African American: 104 mL/min/{1.73_m2} (ref >=60)
GFR, Est Non African American: 89 mL/min/{1.73_m2} (ref >=60)
Globulin: 2.6 g/dL (calc) (ref 1.9–3.7)
Glucose, Bld: 132 mg/dL — ABNORMAL HIGH (ref 65–99)
Potassium: 3.5 mmol/L (ref 3.5–5.3)
Sodium: 140 mmol/L (ref 135–146)
Total Bilirubin: 0.5 mg/dL (ref 0.2–1.2)
Total Protein: 7 g/dL (ref 6.1–8.1)

## 2020-04-20 LAB — LIPID PANEL
Cholesterol: 195 mg/dL (ref <200)
HDL: 42 mg/dL (ref >=40)
LDL Cholesterol (Calc): 108 mg/dL (calc) — ABNORMAL HIGH
Non-HDL Cholesterol (Calc): 153 mg/dL (calc) — ABNORMAL HIGH (ref <130)
Total CHOL/HDL Ratio: 4.6 (calc) (ref <5.0)
Triglycerides: 334 mg/dL — ABNORMAL HIGH (ref <150)

## 2020-04-20 LAB — URIC ACID: Uric Acid, Serum: 6.9 mg/dL (ref 4.0–8.0)

## 2020-04-20 MED ORDER — ATORVASTATIN CALCIUM 20 MG PO TABS: 20.0000 mg | ORAL_TABLET | Freq: Every day | ORAL | 0 refills | Status: DC

## 2020-04-20 MED ORDER — COLCHICINE 0.6 MG PO TABS: 1.2000 mg | ORAL_TABLET | Freq: Every day | ORAL | 0 refills | Status: AC | PRN

## 2020-04-20 MED ORDER — ALLOPURINOL 100 MG PO TABS: 100.0000 mg | ORAL_TABLET | Freq: Two times a day (BID) | ORAL | 0 refills | Status: DC

## 2020-04-20 MED ORDER — LISINOPRIL 20 MG PO TABS: 20.0000 mg | ORAL_TABLET | Freq: Every day | ORAL | 0 refills | Status: DC

## 2020-04-20 MED ORDER — ASPIRIN EC 81 MG PO TBEC: 81.0000 mg | DELAYED_RELEASE_TABLET | Freq: Every day | ORAL | 0 refills | Status: AC

## 2020-04-20 MED ORDER — METOPROLOL SUCCINATE ER 25 MG PO TB24: 25.0000 mg | ORAL_TABLET | Freq: Every day | ORAL | 0 refills | Status: DC

## 2020-04-20 MED ORDER — CITALOPRAM HYDROBROMIDE 20 MG PO TABS: 20.0000 mg | ORAL_TABLET | Freq: Every day | ORAL | 0 refills | Status: DC

## 2020-04-20 NOTE — Progress Notes (Signed)
Name: Marcus Gray   MRN: [id number]    DOB: [birth year]   Date:04/20/2020       Progress Note  Subjective  Chief Complaint  Chief Complaint  Patient presents with  . New Patient (Initial Visit)  . Establish Care    HPI    CAD: patient had acute MI, with chest pain and dizziness, went to hospital in [place] back in 2016. He was admitted and had two stents placed. He moved to [facility] about 3 years ago and ran out of medications. He used to take Imdur, metoprolol, lisinopril, Atorvastatin , plavix. He states 2 months ago he felt tired and drained, went to [facility] , his bp was high and was given lisinopril 20 mg, however ran out of medication again. He was waiting to find a doctor that spoke portuguese. He used to go to doctor's visits with his daughter when he lived in [place], explained [place] has interpreters to help him but glad he finally got back in the system. He denies chest pain, palpitation or SOB, he has however noticed some fatigue and shoulder heaviness at times.   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 We will resume medication  Gout: he was diagnosed with gout when he lived in [place] and used to take Allopurinol. He is having pain, swelling, redness and increase in warmth of first toe over the past two days, very painful to walk. Discussed gout appropriate diet and how to take colchicine and resume allopurinol next week after acute attack  Tobacco use : he started smoking after his MI, down to about 6 cigarettes daily, explained importance of quitting    Family History  Problem Relation Age of Onset  . Diabetes Mother   . Emphysema Father   . Cerebral aneurysm Daughter     Social History   Tobacco Use  . Smoking status:  Current Every Day Smoker    Packs/day: 0.25    Types: Cigarettes    Start date: 04/20/2016  . Smokeless tobacco: Never Used  Substance Use Topics  . Alcohol use: Yes    Alcohol/week: 5.0 standard drinks    Types: 5 Glasses of wine per week     Current Outpatient Medications:  .  lisinopril (ZESTRIL) 20 MG tablet, Take 1 tablet (20 mg total) by mouth daily., Disp: 90 tablet, Rfl: 0 .  allopurinol (ZYLOPRIM) 100 MG tablet, Take 1 tablet (100 mg total) by mouth 2 (two) times daily., Disp: 180 tablet, Rfl: 0 .  aspirin EC 81 MG tablet, Take 1 tablet (81 mg total) by mouth daily., Disp: 30 tablet, Rfl: 0 .  atorvastatin (LIPITOR) 20 MG tablet, Take 1 tablet (20 mg total) by mouth daily., Disp: 90 tablet, Rfl: 0 .  citalopram (CELEXA) 20 MG tablet, Take 1 tablet (20 mg total) by mouth daily., Disp: 90 tablet, Rfl: 0 .  colchicine 0.6 MG tablet, Take 2 tablets (1.2 mg total) by mouth daily as needed. And may repeat in 24 hours if no improvement, after that may stop medication and take prn, Disp: 30 tablet, Rfl: 0 .  metoprolol succinate (TOPROL-XL) 25 MG 24 hr tablet, Take 1 tablet (25 mg total) by mouth daily., Disp: 90 tablet, Rfl: 0  No  Known Allergies  I personally reviewed active problem list, medication list, allergies, family history, social history, health maintenance with the patient/caregiver today.   ROS  Constitutional: Negative for fever , positive for  weight change.  Respiratory: Negative for cough and shortness of breath.   Cardiovascular: Negative for chest pain or palpitations.  Gastrointestinal: Negative for abdominal pain, no bowel changes.  Musculoskeletal: Negative for gait problem or joint swelling.  Skin: Negative for rash.  Neurological: Negative for dizziness or headache.  No other specific complaints in a complete review of systems (except as listed in HPI above).  Objective  Vitals:   04/20/20 1521  BP: (!) 158/100  Pulse: 86  Resp: 16  Temp: 98.2 F  (36.8 C)  TempSrc: Oral  SpO2: 99%  Weight: 175 lb 6.4 oz (79.6 kg)  Height: 5' 7.5" (1.715 m)    Body mass index is 27.07 kg/m.  Physical Exam  Constitutional: Patient appears well-developed and well-nourished. No distress.  HEENT: head atraumatic, normocephalic, pupils equal and reactive to light, neck supple Cardiovascular: Normal rate, regular rhythm and normal heart sounds.  No murmur heard. No BLE edema. Pulmonary/Chest: Effort normal and breath sounds normal. No respiratory distress. Abdominal: Soft.  There is no tenderness. Muscular Skeletal; right foot swollen, worse on right first toe and metatarsal, increase in warmth, very mild erythema  Psychiatric: Patient has a normal mood and affect. behavior is normal. Judgment and thought content normal.  PHQ2/9: Depression screen PHQ 2/9 04/20/2020  Decreased Interest 3  Down, Depressed, Hopeless 3  PHQ - 2 Score 6  Altered sleeping 1  Tired, decreased energy 3  Change in appetite 3  Feeling bad or failure about yourself  0  Trouble concentrating 0  Moving slowly or fidgety/restless 0  Suicidal thoughts 0  PHQ-9 Score 13  Difficult doing work/chores Very difficult    phq 9 is positive   Fall Risk: Fall Risk  04/20/2020  Falls in the past year? 0  Number falls in past yr: 0  Injury with Fall? 0     Functional Status Survey: Is the patient deaf or have difficulty hearing?: Yes Does the patient have difficulty seeing, even when wearing glasses/contacts?: Yes Does the patient have difficulty concentrating, remembering, or making decisions?: No Does the patient have difficulty walking or climbing stairs?: Yes Does the patient have difficulty dressing or bathing?: No Does the patient have difficulty doing errands alone such as visiting a doctor's office or shopping?: Yes    Assessment & Plan  1. History of MI (myocardial infarction)  - Ambulatory referral to Cardiology - metoprolol succinate (TOPROL-XL) 25 MG  24 hr tablet; Take 1 tablet (25 mg total) by mouth daily.  Dispense: 90 tablet; Refill: 0 - lisinopril (ZESTRIL) 20 MG tablet; Take 1 tablet (20 mg total) by mouth daily.  Dispense: 90 tablet; Refill: 0 - atorvastatin (LIPITOR) 20 MG tablet; Take 1 tablet (20 mg total) by mouth daily.  Dispense: 90 tablet; Refill: 0 - aspirin EC 81 MG tablet; Take 1 tablet (81 mg total) by mouth daily.  Dispense: 30 tablet; Refill: 0 - COMPLETE METABOLIC PANEL WITH GFR - Lipid panel  2. Acute gout involving toe of right foot, unspecified cause  - Uric acid  3. Tobacco use   4. Hypertension, benign  - metoprolol succinate (TOPROL-XL) 25 MG 24 hr tablet; Take 1 tablet (25 mg total) by mouth daily.  Dispense: 90 tablet; Refill: 0 - lisinopril (ZESTRIL) 20 MG tablet; Take 1 tablet (20  mg total) by mouth daily.  Dispense: 90 tablet; Refill: 0 - CBC with Differential/Platelet - COMPLETE METABOLIC PANEL WITH GFR  5. Coronary artery disease involving native heart without angina pectoris, unspecified vessel or lesion type  - atorvastatin (LIPITOR) 20 MG tablet; Take 1 tablet (20 mg total) by mouth daily.  Dispense: 90 tablet; Refill: 0  6. Need for hepatitis C screening test  - Hepatitis C antibody  7. Fatigue, unspecified type  - TSH  8. Diabetes mellitus screening  - Hemoglobin A1c  9. Colon cancer screening  - Ambulatory referral to Gastroenterology  10. Weight loss   11. Moderate episode of recurrent major depressive disorder (HCC)  - citalopram (CELEXA) 20 MG tablet; Take 1 tablet (20 mg total) by mouth daily.  Dispense: 90 tablet; Refill: 0  12. Need for immunization against influenza  - Flu Vaccine QUAD High Dose(Fluad)  13. Need for pneumococcal vaccination  - Pneumococcal conjugate vaccine 13-valent IM.

## 2020-04-20 NOTE — Patient Instructions (Signed)
Gota Gout  Gota  um problema que causa inchao doloroso das articulaes. Gota  um tipo de [employer] (artrite). Esse quadro clnico  causado por excesso de cido rico no organismo. O cido rico  uma substncia qumica que se forma quando o organismo decompe substncias chamadas purinas. As purinas so importantes para a composio de protenas no organismo. Quando o organismo tem cido rico demais, cristais pontiagudos podem se formar e se [employer]. Isso causa dor e inchao. Crises de gota podem ocorrer rapidamente e ser [employer] dolorosas (gota [place]). Com o tempo, os ataque podem afetar mais articulaes e se tornar mais frequentes (gota crnica). A gota tambm pode fazer com que cido rico se acumule sob a pele e [employer]. Quais so as causas? Esse quadro clnico  causado pelo excesso de cido rico no sangue. Isso pode acontecer se:  Os rins no removerem cido rico suficiente do sangue. Essa  a causa mais comum.  O organismo produzir cido [employer]. Isso pode ocorrer no caso de alguns tipos de cncer e tratamento do cncer. Isso tambm pode ocorrer caso seu organismo esteja decompondo hemcias demais (anemia hemoltica).  Ingesto de alimentos demais com elevado teor de purinas. Esses alimentos incluem carnes de rgos e alguns frutos do mar. O lcool, especialmente a cerveja, tambm  rico em purinas. Um ataque de gota pode ser causado por trauma ou estresse. O que aumenta o risco? Voc ter maior probabilidade de apresentar esse quadro clnico se:  Tiver histrico familiar de gota.  For do sexo masculino e de meia-idade.  For do sexo feminino e tiver passado pela menopausa.  For obeso.  Beber lcool com frequncia, especialmente cerveja.  Estiver desidratado.  Perder peso muito rapidamente.  Tiver um rgo transplantado.  Tiver passado por envenenamento por chumbo.  Tomar certos medicamentos, incluindo aspirina,  ciclosporina, diurticos, levodopa e niacina.  Tiver doena renal.  Tiver uma doena cutnea chamada psorase. Quais so os sinais ou sintomas? Ataques agudos de gota acontecem rapidamente. Eles em geral acontecem em somente uma articulao. O lugar mais comum  o dedo do p. Os ataques com frequncia comeam  noite. Outras articulaes que podem ser afetadas incluem articulaes do p, tornozelo, joelho, dedos da mo, pulso e cotovelo. Os sintomas deste quadro podem incluir:  Dor intensa.  Calor.  Inchao.  Rigidez.  Sensibilidade. A articulao afetada pode ficar muito dolorida ao toque.  Pele brilhante, avermelhada ou roxa.  Calafrios e febre. A gota crnica pode causar sintomas com maior frequncia. Mais articulaes podem ser afetadas. Voc pode tambm apresentar caroos brancos ou amarelos (tofos) nas mos ou ps ou em outras reas [employer]. Como esse quadro clnico  diagnosticado? Esse quadro clnico  diagnosticado com base nos seus sintomas, em um histrico mdico e em um exame fsico. Voc poder fazer exames como:  Exames de sangue para medir os nveis de cido rico.  Coleta de lquido da articulao com uma agulha (aspirao) para verificar se h cristais de cido rico.  Radiografias para buscar sinais de danos  articulao. Como esse quadro clnico  tratado? O tratamento desse quadro clnico tem duas fases: o tratamento de um ataque agudo e a preveno de ataques futuros. O tratamento da gota aguda pode incluir medicamentos para reduzir a dor e o inchao, incluindo:  AINEs (anti-inflamatrios no esteroides).  Esteroides. Esses so potentes medicamentos anti-inflamatrios que podem ser tomados pela boca (oralmente) ou injetados em uma articulao.  Colchicina. Esse medicamento alivia a dor  e o inchao quando tomado logo aps um ataque. Pode ser administrado oralmente ou atravs de um tubo intravenoso (IV). O tratamento preventivo pode  incluir:  Uso dirio de doses menores de AINEs (anti-inflamatrios no esteroides) ou colchicina.  Uso de um medicamento para reduzir os nveis de cido rico no sangue.  Alteraes da [employer]. Voc pode precisar consultar-se com um nutricionista para saber o que comer e beber para Heritage manager. Siga essas instrues em casa: Durante uma crise de gota   Caso receba orientao para fazer isso, aplique gelo na rea afetada: ? Coloque gelo em uma sacola plstica. ? Coloque uma toalha entre a pele e a sacola. ? Deixe o gelo por 20 minutos, de 2-3 vezes ao dia.  Erga (eleve) a articulao afetada acima do nvel do seu corao tanto quanto possvel.  Descanse a articulao tanto quanto possvel. Caso a articulao afetada pertena  Desmond Dike, voc poder receber [employer].  Siga as instrues do seu mdico no que se refere a restries de [facility]. Como evitar futuros ataques de gota  Siga uma dieta com baixo teor de purina como determinado pelo seu nutricionistas ou mdico. Evite alimentos e bebidas ricas em purinas, incluindo fgado, rim, anchovas, aspargos, arenque, cogumelos, mexilhes e cerveja.  Mantenha um peso sadio ou perca peso caso tenha sobrepeso. Caso [place] perder peso, converse com o seu mdico.  importante que voc no perca peso rpido demais.  Comece ou mantenha um programa de exerccios de acordo com as orientaes do seu mdico. Alimentos e bebidas  Beba lquido em quantidade suficiente para manter sua urina na cor amarelo-plida.  Se voc consome lcool: ? Limite seu consumo a:  0-1 drinque para mulheres.  0-2 drinques para homens. ? Observe quanto lcool sua bebida contm. Nos EUA, um drinque equivale a uma lata de cerveja de 12 oz (355 ml), uma taa de vinho de 5 oz (148 ml) ou uma dose de bebida destilada de 1 oz (44 ml). Instrues gerais  Tome medicamentos vendidos com ou sem receita mdica somente de acordo com as indicaes do seu  mdico.  No dirija nem use mquinas pesadas enquanto estiver tomando analgsicos vendidos com receita mdica.  Retorne a suas atividades normais de acordo com as orientaes do seu mdico. Pergunte ao seu mdico quais atividades so seguras para voc.  Comparea a todas as consultas de acompanhamento de acordo com as orientaes do seu mdico. Isso  importante. Entre em contato com um mdico se voc apresentar:  Laverle Patter crise de gota.  Continuar a ter sintomas de crise de gota aps 10 dias de tratamento.  Efeitos [employer].  Calafrios ou febre.  Sensao de [employer].  Dor na coluna lombar ou na barriga. Busque ajuda imediatamente se voc:  Tiver dores intensas ou incontrolveis.  No conseguir urinar. Resumo  Gota  um inchao doloroso das articulaes causadas por inflamao.  O local mais comum da dor  o dedo, mas pode afetar outras articulaes do corpo.  Medicamentos e mudanas na alimentao podem ajudar a prevenir e tratar crises de gota. Estas informaes no se destinam a substituir as recomendaes de seu mdico. No deixe de discutir quaisquer dvidas com seu mdico. Document Revised: 03/18/2018 Document Reviewed: 03/18/2018 Elsevier Patient Education  2020 [employer].

## 2020-04-30 ENCOUNTER — Encounter: Payer: Self-pay | Admitting: General Practice

## 2020-05-19 ENCOUNTER — Encounter: Payer: Self-pay | Admitting: Family Medicine

## 2020-06-14 NOTE — Progress Notes (Signed)
New Outpatient Visit Date: 06/16/2020  Referring Provider: Steele Sizer, MD [address] [place],  [place] [address]  Chief Complaint: History of heart disease  HPI:  Marcus Gray is a 64 y.o. male who is being seen today for the evaluation of chest pain at the request of Steele Sizer, MD. He has a history of reported MI, hypertension, hyperlipidemia, and arthritis.  Marcus Gray speaks [place]; history is obtained with the assistance of a telephone interpreter.  He presented to the [facility] ED in June complaining of chest pain radiating to the back.  CTA chest and HS-TnI were unrevealing, leading to a diagnosis of bronchitis.  Since that time, he has felt well without any further chest discomfort.  He reports having had a heart attack in 2016 with PCI's to OM1 and the mid RCA at that time in [place], [place].  He states that he did not have chest pain in the setting of his MI but instead felt hot all over.  He has not felt anything reminiscent of that since then.  He remains active at home and walks regularly without any symptoms other than some mild fatigue.  This has been present for years and is stable.  He denies shortness of breath, palpitations, lightheadedness, and edema.  Marcus Gray monitors his blood pressure at home and says that it has been somewhat labile.  Due to elevated blood pressure when he was seen by Dr. Ancil Boozer in September, lisinopril was increased to 20 mg daily.  Atorvastatin was also added for elevated LDL.  He has been tolerating these medications well.  --------------------------------------------------------------------------------------------------  Cardiovascular History & Procedures: Cardiovascular Problems:  Coronary artery disease status post MI (2016)  Risk Factors:  Known coronary artery disease, hypertension, hyperlipidemia, male gender, and age > 41  Cath/PCI:  PCI to [place] and RCA in [place], [place], in 2016.  CV Surgery:  None  EP Procedures and  Devices:  None  Non-Invasive Evaluation(s):  None available  Recent CV Pertinent Labs: Lab Results  Component Value Date   CHOL 195 04/20/2020   HDL 42 04/20/2020   LDLCALC 108 (H) 04/20/2020   TRIG 334 (H) 04/20/2020   CHOLHDL 4.6 04/20/2020   K 3.5 04/20/2020   BUN 8 04/20/2020   CREATININE 0.90 04/20/2020    --------------------------------------------------------------------------------------------------  Past Medical History:  Diagnosis Date   Arthritis    CAD in native artery    Dyslipidemia    History of myocardial infarction    Hypertension    MI (myocardial infarction) ([place])     Past Surgical History:  Procedure Laterality Date   CORONARY STENT PLACEMENT  2016   [place]; OM1 and RCA    Current Meds  Medication Sig   albuterol (PROVENTIL HFA) 108 (90 Base) MCG/ACT inhaler Inhale 2 puffs into the lungs every 4 (four) hours as needed for wheezing or shortness of breath.   allopurinol (ZYLOPRIM) 100 MG tablet Take 1 tablet (100 mg total) by mouth 2 (two) times daily.   aspirin EC 81 MG tablet Take 1 tablet (81 mg total) by mouth daily.   atorvastatin (LIPITOR) 20 MG tablet Take 1 tablet (20 mg total) by mouth daily.   citalopram (CELEXA) 20 MG tablet Take 1 tablet (20 mg total) by mouth daily.   colchicine 0.6 MG tablet Take 2 tablets (1.2 mg total) by mouth daily as needed. And may repeat in 24 hours if no improvement, after that may stop medication and take prn   lisinopril (ZESTRIL)  20 MG tablet Take 1 tablet (20 mg total) by mouth daily.   metoprolol succinate (TOPROL-XL) 25 MG 24 hr tablet Take 1 tablet (25 mg total) by mouth daily.   naproxen (NAPROSYN) 500 MG tablet Take 1 tablet (500 mg total) by mouth 2 (two) times daily with a meal.    Allergies: Patient has no known allergies.  Social History   Tobacco Use   Smoking status: Current Every Day Smoker    Packs/day: 0.25    Types: Cigarettes    Start date: 04/20/2016    Smokeless tobacco: Never Used  Substance Use Topics   Alcohol use: Yes    Alcohol/week: 5.0 standard drinks    Types: 5 Glasses of wine per week    Comment: wine at night   Drug use: Never    Family History  Problem Relation Age of Onset   Diabetes Mother    Emphysema Father    Cerebral aneurysm Daughter     Review of Systems: A 12-system review of systems was performed and was negative except as noted in the HPI.  --------------------------------------------------------------------------------------------------  Physical Exam: BP (!) 140/100 (BP Location: Right Arm, Patient Position: Sitting, Cuff Size: Normal)    Pulse 60    Ht 5' 7.5" (1.715 m)    Wt 175 lb (79.4 kg)    SpO2 98%    BMI 27.00 kg/m   General: NAD. HEENT: No conjunctival pallor or scleral icterus. Facemask in place. Neck: Supple without lymphadenopathy, thyromegaly, JVD, or HJR. No carotid bruit. Lungs: Normal work of breathing. Clear to auscultation bilaterally without wheezes or crackles. Heart: Regular rate and rhythm without murmurs, rubs, or gallops. Non-displaced PMI. Abd: Bowel sounds present. Soft, NT/ND without hepatosplenomegaly Ext: No lower extremity edema. Radial, PT, and DP pulses are 2+ bilaterally Skin: Warm and dry without rash. Neuro: CNIII-XII intact. Strength and fine-touch sensation intact in upper and lower extremities bilaterally. Psych: Normal mood and affect.  EKG:  Normal sinus rhythm with poor R wave progression and inferior infarct.  No significant change from prior tracing on 01/09/2020.  Lab Results  Component Value Date   WBC 6.6 01/09/2020   HGB 16.4 01/09/2020   HCT 46.8 01/09/2020   MCV 100.0 01/09/2020   PLT 141 (L) 01/09/2020    Lab Results  Component Value Date   NA 140 04/20/2020   K 3.5 04/20/2020   CL 106 04/20/2020   CO2 28 04/20/2020   BUN 8 04/20/2020   CREATININE 0.90 04/20/2020   GLUCOSE 132 (H) 04/20/2020   ALT 28 04/20/2020    Lab Results    Component Value Date   CHOL 195 04/20/2020   HDL 42 04/20/2020   LDLCALC 108 (H) 04/20/2020   TRIG 334 (H) 04/20/2020   CHOLHDL 4.6 04/20/2020     --------------------------------------------------------------------------------------------------  ASSESSMENT AND PLAN: Coronary artery disease: Marcus Gray does not have any symptoms to suggest worsening coronary insufficiency.  His single episode of chest pain that led to ED visit this summer has not recurred.  It is notable that he felt hot all over at the time of his MI in 2016.  He has not felt any symptoms similar to that.  We will request records from his hospitalization in [place], [place], in 2016.  We will plan to continue aspirin and atorvastatin at current doses.  We will plan to repeat a lipid panel when he is seen in follow-up in 2 to 3 weeks.  Unless his LDL is less than 50, I  would suggest escalation of atorvastatin to 40 to 80 mg daily, as tolerated.  Hypertension: Blood pressure remains elevated today despite escalation of lisinopril in late September.  I have recommended increasing this to 40 mg daily.  We will continue metoprolol succinate 25 mg daily.  We will plan to check a complete metabolic panel in 2-3 weeks.  Hyperlipidemia: LDL suboptimally controlled on last check in September, prompting addition of atorvastatin 20 mg daily.  As above, we will recheck his lipids and LFTs when he is seen for follow-up in 2 to 3 weeks.  I would have a low threshold for escalation of atorvastatin for secondary prevention.  Follow-up: Return to clinic in 2 to 3 weeks with APP.  Nelva Bush, MD 06/16/2020 10:36 AM

## 2020-06-16 ENCOUNTER — Other Ambulatory Visit: Payer: Self-pay

## 2020-06-16 ENCOUNTER — Ambulatory Visit: Payer: Medicare Other | Admitting: Internal Medicine

## 2020-06-16 ENCOUNTER — Encounter: Payer: Self-pay | Admitting: Internal Medicine

## 2020-06-16 VITALS — BP 140/100 | HR 60 | Ht 67.5 in | Wt 175.0 lb

## 2020-06-16 DIAGNOSIS — E785 Hyperlipidemia, unspecified: Secondary | ICD-10-CM

## 2020-06-16 DIAGNOSIS — I10 Essential (primary) hypertension: Secondary | ICD-10-CM

## 2020-06-16 DIAGNOSIS — I2510 Atherosclerotic heart disease of native coronary artery without angina pectoris: Secondary | ICD-10-CM | POA: Diagnosis not present

## 2020-06-16 MED ORDER — LISINOPRIL 40 MG PO TABS
40.0000 mg | ORAL_TABLET | Freq: Every day | ORAL | 2 refills | Status: DC
Start: 2020-06-16 — End: 2021-01-14

## 2020-06-16 NOTE — Patient Instructions (Signed)
Medication Instructions:  Your physician has recommended you make the following change in your medication:  1- INCREASE Lisinopril to 40 mg by mouth once a day.  *If you need a refill on your cardiac medications before your next appointment, please call your pharmacy*  Lab Work:  Your physician recommends that you return for lab work in: 2-3 weeks prior to appointment. - about 1 hour prior to appointment.  - Please go to the [facility]. You will check in at the front desk to the right as you walk into the atrium. Valet Parking is offered if needed. - No appointment needed. You may go any day between 7 am and 6 pm.  Lab work at the next appointment. Please be fasting so that we can draw lab work to check your cholesterol.  If you have labs (blood work) drawn today and your tests are completely normal, you will receive your results only by: Marland Kitchen MyChart Message (if you have MyChart) OR . A paper copy in the mail If you have any lab test that is abnormal or we need to change your treatment, we will call you to review the results.   Testing/Procedures: none  Follow-Up: At [facility], you and your health needs are our priority.  As part of our continuing mission to provide you with exceptional heart care, we have created designated Provider Care Teams.  These Care Teams include your primary Cardiologist (physician) and Advanced Practice Providers (APPs -  Physician Assistants and Nurse Practitioners) who all work together to provide you with the care you need, when you need it.  We recommend signing up for the patient portal called "MyChart".  Sign up information is provided on this After Visit Summary.  MyChart is used to connect with patients for Virtual Visits (Telemedicine).  Patients are able to view lab/test results, encounter notes, upcoming appointments, etc.  Non-urgent messages can be sent to your provider as well.   To learn more about what you can do with MyChart, go to  [email or web address].    Your next appointment:   2-3 week(s) with APP - needs to be in the morning time so that he can have fasting lab work at appointment.  The format for your next appointment:   In Person  Provider:   You may see DR Harrell Gave END or one of the following Advanced Practice Providers on your designated Care Team:    Murray Hodgkins, NP  Christell Faith, PA-C  Marrianne Mood, PA-C  Cadence T[place], [place]  Laurann Montana, NP

## 2020-06-17 ENCOUNTER — Encounter: Payer: Self-pay | Admitting: Internal Medicine

## 2020-06-17 DIAGNOSIS — I2510 Atherosclerotic heart disease of native coronary artery without angina pectoris: Secondary | ICD-10-CM | POA: Insufficient documentation

## 2020-06-17 DIAGNOSIS — I10 Essential (primary) hypertension: Secondary | ICD-10-CM | POA: Insufficient documentation

## 2020-06-17 DIAGNOSIS — E785 Hyperlipidemia, unspecified: Secondary | ICD-10-CM | POA: Insufficient documentation

## 2020-07-07 ENCOUNTER — Other Ambulatory Visit: Payer: Self-pay

## 2020-07-07 ENCOUNTER — Encounter: Payer: Self-pay | Admitting: Family

## 2020-07-07 ENCOUNTER — Ambulatory Visit: Payer: Medicare Other | Admitting: Family

## 2020-07-07 VITALS — BP 144/82 | HR 57 | Ht 67.5 in | Wt 177.0 lb

## 2020-07-07 DIAGNOSIS — E785 Hyperlipidemia, unspecified: Secondary | ICD-10-CM

## 2020-07-07 DIAGNOSIS — I10 Essential (primary) hypertension: Secondary | ICD-10-CM

## 2020-07-07 DIAGNOSIS — I2510 Atherosclerotic heart disease of native coronary artery without angina pectoris: Secondary | ICD-10-CM | POA: Diagnosis not present

## 2020-07-07 DIAGNOSIS — Z79899 Other long term (current) drug therapy: Secondary | ICD-10-CM

## 2020-07-07 DIAGNOSIS — I255 Ischemic cardiomyopathy: Secondary | ICD-10-CM | POA: Diagnosis not present

## 2020-07-07 NOTE — Patient Instructions (Signed)
Medication Instructions:  No changes today.  *If you need a refill on your cardiac medications before your next appointment, please call your pharmacy*  Lab Work: Your physician recommends that you return for lab work today: CMP, lipid panel  If you have labs (blood work) drawn today and your tests are completely normal, you will receive your results only by: Marland Kitchen MyChart Message (if you have MyChart) OR . A paper copy in the mail If you have any lab test that is abnormal or we need to change your treatment, we will call you to review the results.  Testing/Procedures: Your EKG today was stable compared to previous.   Follow-Up: At [facility], you and your health needs are our priority.  As part of our continuing mission to provide you with exceptional heart care, we have created designated Provider Care Teams.  These Care Teams include your primary Cardiologist (physician) and Advanced Practice Providers (APPs -  Physician Assistants and Nurse Practitioners) who all work together to provide you with the care you need, when you need it.  We recommend signing up for the patient portal called "MyChart".  Sign up information is provided on this After Visit Summary.  MyChart is used to connect with patients for Virtual Visits (Telemedicine).  Patients are able to view lab/test results, encounter notes, upcoming appointments, etc.  Non-urgent messages can be sent to your provider as well.   To learn more about what you can do with MyChart, go to [email or web address].    Your next appointment:   6 week(s)  The format for your next appointment:   In Person  Provider:   You may see Nelva Bush, MD or one of the following Advanced Practice Providers on your designated Care Team:    Murray Hodgkins, NP  Christell Faith, PA-C  Marrianne Mood, PA-C  Cadence Kathlen Mody, [place]  Laurann Montana, NP  Other Instructions  Write your blood pressures down at home and bring with you to next  office visit.

## 2020-07-07 NOTE — Progress Notes (Signed)
Office Visit    Patient Name: Marcus Gray Date of Encounter: 07/07/2020  Primary Care Provider:  Steele Sizer, MD Primary Cardiologist:  Nelva Bush, MD Electrophysiologist:  None   Chief Complaint    [facility] Marcus Gray is a 64 y.o. male with a hx ofcoronary artery disease s/p DES to RCA and OM1 in 2015, ischemic cardiomyopathy (EF 45% 2015), hx of alcohol use, thrombocytopenia, HTN, HLD, arthritis presents today for follow-up after increased dose of lisinopril.  Past Medical History    Past Medical History:  Diagnosis Date  . Arthritis   . CAD in native artery   . Dyslipidemia   . History of myocardial infarction   . Hypertension   . MI (myocardial infarction) [facility])    Past Surgical History:  Procedure Laterality Date  . CORONARY STENT PLACEMENT  2016   [place]; OM1 and RCA    Allergies  No Known Allergies  History of Present Illness    Marcus Gray is a 64 y.o. male with a history of coronary artery disease s/p DES to RCA and OM1 in 2015, ischemic cardiomyopathy (EF 45% 2015), hx of alcohol use, thrombocytopenia, HTN, HLD, arthritis last seen 06/16/2020 by Dr. Saunders Revel.  He was admitted to [facility] 06/18/14 - 06/20/14.  Records reviewed.  He was referred to the ED at that time for abnormal liver test and concern for Tylenol toxicity and empirically treated with N acetylcysteine.  CXR showed vascular congestion.  Echocardiogram 06/18/14 mild concentric LVH, LVEF 45%, inferior wall severe hypokinesis, basal posterior lateral wall hypokinesis, mild LA enlargement, moderate MR without stenosis, trileaflet aortic valve, moderate TR, PASP 30 mmHg, small pericardial efusion that was circumferential.  Due to wall motion abnormalities he was evaluated by cardiology.  He had a left heart cath 06/19/2014 via the right radial artery.  He underwent balloon angioplasty and DES to RCA (Promus 2.25x28) which was 100% occluded and OM1 (2.5x20).   Morning.   Evaluated cardiology and underwent cardiac catheterization 06/19/2020 with DES to RCA and OM1.  He was discharged on aspirin, atorvastatin, Plavix, Lasix, lisinopril, metoprolol tartrate.  LDL during admission was 76.  He reports a history of MI in 2016 with PCI to OM1 and mid RCA at that time and [place], [place]. His previous anginal equivalent was not chest pain but instead feeling hot all over.  He was seen in the [facility] ED June 2021 complaining of chest pain radiating to back. CTA chest and high-sensitivity troponin unrevealing leading to diagnosis of bronchitis.  He was to establish cardiology care by Dr. Saunders Revel 06/16/2020. He noted no anginal symptoms and was feeling overall well since his hospitalization June 2021. His lisinopril had been increased to 20 mg daily due to elevated blood pressure by PCP in September. Atorvastatin was also added for elevated LDL. When seen by Dr. Saunders Revel he was noted to have elevated blood pressure and his lisinopril was increased to 40 mg daily and metoprolol succinate continued at 25 mg daily.  He presents today for follow-up.  Visit assisted by interpreter via telephone. Reports BP at home is routinely in the 110-130s when checked with arm cuff. He did not take his medication yet this morning prior to our office visit.  He reports no chest pain, pressure, tightness.  Reports some back and shoulder pain which is relieved by Tylenol, encouraged to discuss with his primary care provider.  He reports no shortness of breath at rest or dyspnea on exertion.  He endorses feeling  overall well.  EKGs/Labs/Other Studies Reviewed:   The following studies were reviewed today:   EKG:  EKG is  ordered today.  The ekg ordered today demonstrates sinus bradycardia 57 bpm with poor R wave progression and stable prior inferior infarct.  No acute changes.  Recent Labs: 01/09/2020: Hemoglobin 16.4; Platelets 141 04/20/2020: ALT 28; BUN 8; Creat 0.90; Potassium 3.5; Sodium 140   Recent Lipid Panel    Component Value Date/Time   CHOL 195 04/20/2020 1630   TRIG 334 (H) 04/20/2020 1630   HDL 42 04/20/2020 1630   CHOLHDL 4.6 04/20/2020 1630   LDLCALC 108 (H) 04/20/2020 1630   Home Medications   Current Meds  Medication Sig  . albuterol (PROVENTIL HFA) 108 (90 Base) MCG/ACT inhaler Inhale 2 puffs into the lungs every 4 (four) hours as needed for wheezing or shortness of breath.  . allopurinol (ZYLOPRIM) 100 MG tablet Take 1 tablet (100 mg total) by mouth 2 (two) times daily.  Marland Kitchen aspirin EC 81 MG tablet Take 1 tablet (81 mg total) by mouth daily.  Marland Kitchen atorvastatin (LIPITOR) 20 MG tablet Take 1 tablet (20 mg total) by mouth daily.  . citalopram (CELEXA) 20 MG tablet Take 1 tablet (20 mg total) by mouth daily.  . colchicine 0.6 MG tablet Take 2 tablets (1.2 mg total) by mouth daily as needed. And may repeat in 24 hours if no improvement, after that may stop medication and take prn  . lisinopril (ZESTRIL) 40 MG tablet Take 1 tablet (40 mg total) by mouth daily.  . metoprolol succinate (TOPROL-XL) 25 MG 24 hr tablet Take 1 tablet (25 mg total) by mouth daily.  . naproxen (NAPROSYN) 500 MG tablet Take 1 tablet (500 mg total) by mouth 2 (two) times daily with a meal.     Review of Systems   Review of Systems  Constitutional: Negative for chills, fever and malaise/fatigue.  Cardiovascular: Negative for chest pain, dyspnea on exertion, leg swelling, near-syncope, orthopnea, palpitations and syncope.  Respiratory: Negative for cough, shortness of breath and wheezing.   Gastrointestinal: Negative for nausea and vomiting.  Neurological: Negative for dizziness, light-headedness and weakness.   All other systems reviewed and are otherwise negative except as noted above.  Physical Exam    VS:  BP (!) 144/82 (BP Location: Left Arm, Patient Position: Sitting, Cuff Size: Normal)   Pulse (!) 57   Wt 177 lb (80.3 kg)   SpO2 98%   BMI 27.31 kg/m  , BMI Body mass index is  27.31 kg/m.  Wt Readings from Last 3 Encounters:  07/07/20 177 lb (80.3 kg)  06/16/20 175 lb (79.4 kg)  04/20/20 175 lb 6.4 oz (79.6 kg)    GEN: Well nourished, well developed, in no acute distress. HEENT: normal. Neck: Supple, no JVD, carotid bruits, or masses. Cardiac: RRR, no murmurs, rubs, or gallops. No clubbing, cyanosis, edema.  Radials/DP/PT 2+ and equal bilaterally.  Respiratory:  Respirations regular and unlabored, clear to auscultation bilaterally. GI: Soft, nontender, nondistended. MS: No deformity or atrophy. Skin: Warm and dry, no rash. Neuro:  Strength and sensation are intact. Psych: Normal affect.  Assessment & Plan    1. Coronary artery disease / Ischemic cardiomyopathy - s/p DES to RCA and OM1 in 2016.  Extensive records from outside hospital reviewed for this office visit.  Echo at that time with LVEF 45%.  Euvolemic, well compensated on exam.  GDMT includes aspirin, statin, metoprolol.  No indication for ischemic evaluation at this time.  He reports no chest pain, pressure, tightness.  Reports no shortness of breath at rest nor dyspnea on exertion.  Will discuss with his primary cardiologist whether to update echocardiogram.  2. HTN- BP mildly elevated 140/86 and 144/82.  He has not yet taken his medications this morning.  He reports home systolic blood pressure routinely 110-130.  He is understandably hesitant regarding additional medications as his blood pressures well controlled at home per his report.  He is agreeable to keep blood pressure log and bring it to his next clinic visit.  I would not further escalate his beta-blocker therapy due to relative bradycardia.  3. HLD-04/20/2020 LDL 108.  He was started on Atorvasatin 20mg . Lipid panel, CMP today. If LDL not <50 plan to increase to Atorvastatin 40mg  vs 80mg .    Disposition: Follow up in 6 week(s) with Dr. Saunders Revel or APP  Signed, Loel Dubonnet, NP 07/07/2020, 3:08 PM [place]

## 2020-07-08 ENCOUNTER — Telehealth: Payer: Self-pay | Admitting: *Deleted

## 2020-07-08 LAB — COMPREHENSIVE METABOLIC PANEL
ALT: 37 IU/L (ref 0–44)
AST: 24 IU/L (ref 0–40)
Albumin/Globulin Ratio: 1.9 (ref 1.2–2.2)
Albumin: 4.7 g/dL (ref 3.8–4.8)
Alkaline Phosphatase: 84 IU/L (ref 44–121)
BUN/Creatinine Ratio: 10 (ref 10–24)
BUN: 8 mg/dL (ref 8–27)
Bilirubin Total: 0.5 mg/dL (ref 0.0–1.2)
CO2: 22 mmol/L (ref 20–29)
Calcium: 9.3 mg/dL (ref 8.6–10.2)
Chloride: 105 mmol/L (ref 96–106)
Creatinine, Ser: 0.8 mg/dL (ref 0.76–1.27)
GFR calc Af Amer: 108 mL/min/{1.73_m2} (ref >59)
GFR calc non Af Amer: 93 mL/min/{1.73_m2} (ref >59)
Globulin, Total: 2.5 g/dL (ref 1.5–4.5)
Glucose: 93 mg/dL (ref 65–99)
Potassium: 4.3 mmol/L (ref 3.5–5.2)
Sodium: 141 mmol/L (ref 134–144)
Total Protein: 7.2 g/dL (ref 6.0–8.5)

## 2020-07-08 LAB — LIPID PANEL
Chol/HDL Ratio: 4 ratio (ref 0.0–5.0)
Cholesterol, Total: 190 mg/dL (ref 100–199)
HDL: 47 mg/dL (ref >39)
LDL Chol Calc (NIH): 105 mg/dL — ABNORMAL HIGH (ref 0–99)
Triglycerides: 219 mg/dL — ABNORMAL HIGH (ref 0–149)
VLDL Cholesterol Cal: 38 mg/dL (ref 5–40)

## 2020-07-08 NOTE — Telephone Encounter (Signed)
When pt returns call (needs [place] interpreter) please discuss lab results and med changes below and pt needs echo scheduled see separate message below regarding Echo.

## 2020-07-08 NOTE — Telephone Encounter (Signed)
-----   Message from Loel Dubonnet, NP sent at 07/08/2020  8:37 AM EST ----- Regarding: Echo Case discussed with Dr. Saunders Revel. He recommended updating an echocardiogram due to history of ischemic cardiomyopathy. In office 07/08/20 I told him we might do an echo but would check with Dr. Saunders Revel first so he's aware it was a possibility.   Please order echo for ischemic cardiomyopathy. If possible, to be scheduled prior to his 6 week follow up.   Of note, he also had lab results to be called.   Thank you!! Urban Gibson

## 2020-07-08 NOTE — Telephone Encounter (Signed)
Attempted to call pt (via [place] interpreter [phone]) to give lab results and discuss medication changes in below message. No answer. Interpreter left voicemail for pt to call back.

## 2020-07-08 NOTE — Telephone Encounter (Signed)
-----   Message from Loel Dubonnet, NP sent at 07/08/2020  7:10 AM EST ----- Normal kidney, liver function, electrolytes. LDL not at goal of <70. Increase Atorvastatin to 80 mg daily. Will repeat labs at follow up.

## 2020-07-14 NOTE — Telephone Encounter (Signed)
Attempted to call pt again to give lab results and discuss med changes via [place] interpreter # [phone]. No answer. Message was left on vm to call back.

## 2020-07-16 NOTE — Telephone Encounter (Signed)
Sounds great. I'll make myself a note to schedule echocardiogram while he is here in clinic. Thank you for trying to get ahold of him!  Loel Dubonnet, NP

## 2020-07-16 NOTE — Telephone Encounter (Signed)
Third attempt made to contact pt with lab results and medication changes via [place] interpreter [phone]. Have tried calling at different times of day without success. Pt has follow up 08/24/20. We may have to make changes while pt in clinic due to difficult to reach. Will notify provider.

## 2020-07-19 NOTE — Progress Notes (Signed)
Name: Marcus Gray   MRN: [id number]    DOB: 1955/12/25   Date:07/20/2020       Progress Note  Subjective  Chief Complaint  Follow up   HPI   CAD: patient had acute MI, with chest pain and dizziness, went to hospital in [place] back in 2016. He was admitted and had two stents placed. He moved to [facility] about 3 years ago and ran out of medications. He used to take Imdur, metoprolol, lisinopril, Atorvastatin , plavix. He states 2 months ago he felt tired and drained, went to [facility] , his bp was high and was given lisinopril 20 mg, however ran out of medication again. He was waiting to find a doctor that spoke portuguese. He used to go to doctor's visits with his daughter when he lived in [place], explained [place] has interpreters to help him but glad he finally got back in the system. He denies chest pain, palpitation or SOB, he has however noticed some fatigue and shoulder heaviness at times.  He has seen cardiologist , Dr. Saunders Revel, since his last visit with me. He is now on metoprolol, higher dose Lisinopril and also aspirin, atorvastatin dose was increased to 80 mg but he is still only taking 40 mg. We will send new rx to pharmacy. 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 He has been taking celexa since September but not sure if feeling better. Weight is stable now, he had lost 10 lbs previously . Phq 9 has improved. He feels isolated, discussed ways to meet other [place] in the area   Gout: he is taking Allopurinol and no recent gout attacks.   Tobacco use : he started smoking after his MI, down to about 3 cigarettes daily, explained importance of quitting  HTN: bp is at goal now, on higher dose of lisinpril and  also metoprolol, no side effects   Shoulder pain: usually triggered by activity like raising arms and when he goes to bed, discussed home exercises   Patient Active Problem List   Diagnosis Date Noted  . Coronary artery disease involving native coronary artery of native heart without angina pectoris 06/17/2020  . Essential hypertension 06/17/2020  . Hyperlipidemia LDL goal <70 06/17/2020    Past Surgical History:  Procedure Laterality Date  . CORONARY STENT PLACEMENT  2016   [place]; OM1 and RCA    Family History  Problem Relation Age of Onset  . Diabetes Mother   . Emphysema Father   . Cerebral aneurysm Daughter     Social History   Tobacco Use  . Smoking status: Current Every Day Smoker    Packs/day: 0.25    Types: Cigarettes    Start date: 04/20/2016  . Smokeless tobacco: Never Used  Substance Use Topics  . Alcohol use: Yes    Alcohol/week: 5.0 standard drinks    Types: 5 Glasses of wine per week    Comment: wine at night     Current Outpatient Medications:  .  albuterol (PROVENTIL HFA) 108 (90 Base) MCG/ACT inhaler, Inhale 2 puffs into the lungs every 4 (four) hours as needed for wheezing or shortness of breath., Disp: 18 g, Rfl: 0 .  aspirin EC  81 MG tablet, Take 1 tablet (81 mg total) by mouth daily., Disp: 30 tablet, Rfl: 0 .  colchicine 0.6 MG tablet, Take 2 tablets (1.2 mg total) by mouth daily as needed. And may repeat in 24 hours if no improvement, after that may stop medication and take prn, Disp: 30 tablet, Rfl: 0 .  lisinopril (ZESTRIL) 40 MG tablet, Take 1 tablet (40 mg total) by mouth daily., Disp: 90 tablet, Rfl: 2 .  allopurinol (ZYLOPRIM) 100 MG tablet, Take 1 tablet (100 mg total) by mouth 2 (two) times daily., Disp: 180 tablet, Rfl: 1 .  atorvastatin (LIPITOR) 80 MG tablet, Take 1 tablet (80 mg total) by mouth daily., Disp: 90 tablet, Rfl: 1 .  citalopram (CELEXA) 20 MG tablet, Take 1 tablet (20 mg total) by mouth daily., Disp: 90 tablet, Rfl:  1 .  metoprolol succinate (TOPROL-XL) 25 MG 24 hr tablet, Take 1 tablet (25 mg total) by mouth daily., Disp: 90 tablet, Rfl: 1  No Known Allergies  I personally reviewed active problem list, medication list, allergies, family history, social history, health maintenance with the patient/caregiver today.   ROS  Constitutional: Negative for fever or weight change.  Respiratory: Negative for cough and shortness of breath.   Cardiovascular: Negative for chest pain or palpitations.  Gastrointestinal: Negative for abdominal pain, no bowel changes.  Musculoskeletal: Negative for gait problem or joint swelling.  Skin: Negative for rash.  Neurological: Negative for dizziness or headache.  No other specific complaints in a complete review of systems (except as listed in HPI above).  Objective  Vitals:   07/20/20 1043  BP: 136/86  Pulse: 79  Resp: 16  Temp: 97.6 F (36.4 C)  TempSrc: Oral  SpO2: 98%  Weight: 176 lb 14.4 oz (80.2 kg)  Height: 5' 8" (1.727 m)    Body mass index is 26.9 kg/m.  Physical Exam  Constitutional: Patient appears well-developed and well-nourished. Overweight.  No distress.  HEENT: head atraumatic, normocephalic, pupils equal and reactive to light,  neck supple Cardiovascular: Normal rate, regular rhythm and normal heart sounds.  No murmur heard. No BLE edema. Pulmonary/Chest: Effort normal and breath sounds normal. No respiratory distress. Abdominal: Soft.  There is no tenderness. Psychiatric: Patient has a normal mood and affect. behavior is normal. Judgment and thought content normal.  Recent Results (from the past 2160 hour(s))  Comprehensive metabolic panel     Status: None   Collection Time: 07/07/20 10:18 AM  Result Value Ref Range   Glucose 93 65 - 99 mg/dL   BUN 8 8 - 27 mg/dL   Creatinine, Ser 0.80 0.76 - 1.27 mg/dL   GFR calc non Af Amer 93 >59 mL/min/1.73   GFR calc Af Amer 108 >59 mL/min/1.73    Comment: **In accordance with  recommendations from the NKF-ASN Task force,**   [facility] is in the process of updating its eGFR calculation to the   2021 CKD-EPI creatinine equation that estimates kidney function   without a race variable.    BUN/Creatinine Ratio 10 10 - 24   Sodium 141 134 - 144 mmol/L   Potassium 4.3 3.5 - 5.2 mmol/L   Chloride 105 96 - 106 mmol/L   CO2 22 20 - 29 mmol/L   Calcium 9.3 8.6 - 10.2 mg/dL   Total Protein 7.2 6.0 - 8.5 g/dL   Albumin 4.7 3.8 - 4.8 g/dL   Globulin, Total 2.5 1.5 - 4.5 g/dL   Albumin/Globulin Ratio 1.9 1.2 - 2.2  Bilirubin Total 0.5 0.0 - 1.2 mg/dL   Alkaline Phosphatase 84 44 - 121 IU/L    Comment:               **Please note reference interval change**   AST 24 0 - 40 IU/L   ALT 37 0 - 44 IU/L  Lipid panel     Status: Abnormal   Collection Time: 07/07/20 10:18 AM  Result Value Ref Range   Cholesterol, Total 190 100 - 199 mg/dL   Triglycerides 219 (H) 0 - 149 mg/dL   HDL 47 >39 mg/dL   VLDL Cholesterol Cal 38 5 - 40 mg/dL   LDL Chol Calc (NIH) 105 (H) 0 - 99 mg/dL   Chol/HDL Ratio 4.0 0.0 - 5.0 ratio    Comment:                                   T. Chol/HDL Ratio                                             Men  Women                               1/2 Avg.Risk  3.4    3.3                                   Avg.Risk  5.0    4.4                                2X Avg.Risk  9.6    7.1                                3X Avg.Risk 23.4   11.0       PHQ2/9: Depression screen [facility] 2/9 07/20/2020 04/20/2020  Decreased Interest 3 3  Down, Depressed, Hopeless 1 3  PHQ - 2 Score 4 6  Altered sleeping 3 1  Tired, decreased energy 3 3  Change in appetite 0 3  Feeling bad or failure about yourself  0 0  Trouble concentrating 0 0  Moving slowly or fidgety/restless 0 0  Suicidal thoughts 0 0  PHQ-9 Score 10 13  Difficult doing work/chores Very difficult Very difficult    phq 9 is positive  Fall Risk: Fall Risk  07/20/2020 04/20/2020  Falls in the past year? 0 0   Number falls in past yr: 0 0  Injury with Fall? 0 0     Functional Status Survey: Is the patient deaf or have difficulty hearing?: No Does the patient have difficulty seeing, even when wearing glasses/contacts?: No Does the patient have difficulty concentrating, remembering, or making decisions?: No Does the patient have difficulty walking or climbing stairs?: No Does the patient have difficulty dressing or bathing?: No Does the patient have difficulty doing errands alone such as visiting a doctor's office or shopping?: No    Assessment & Plan   1. Colon cancer screening  He missed the phone call, advised to call them back   2.  History of MI (myocardial infarction)  - atorvastatin (LIPITOR) 80 MG tablet; Take 1 tablet (80 mg total) by mouth daily.  Dispense: 90 tablet; Refill: 1 - metoprolol succinate (TOPROL-XL) 25 MG 24 hr tablet; Take 1 tablet (25 mg total) by mouth daily.  Dispense: 90 tablet; Refill: 1  3. Hypertension, benign  - metoprolol succinate (TOPROL-XL) 25 MG 24 hr tablet; Take 1 tablet (25 mg total) by mouth daily.  Dispense: 90 tablet; Refill: 1  4. Coronary artery disease involving native heart without angina pectoris, unspecified vessel or lesion type  - atorvastatin (LIPITOR) 80 MG tablet; Take 1 tablet (80 mg total) by mouth daily.  Dispense: 90 tablet; Refill: 1  5. Personal history of gout  - allopurinol (ZYLOPRIM) 100 MG tablet; Take 1 tablet (100 mg total) by mouth 2 (two) times daily.  Dispense: 180 tablet; Refill: 1  6. Moderate episode of recurrent major depressive disorder (HCC)  - citalopram (CELEXA) 20 MG tablet; Take 1 tablet (20 mg total) by mouth daily.  Dispense: 90 tablet; Refill: 1

## 2020-07-20 ENCOUNTER — Encounter: Payer: Self-pay | Admitting: Family Medicine

## 2020-07-20 ENCOUNTER — Ambulatory Visit (INDEPENDENT_AMBULATORY_CARE_PROVIDER_SITE_OTHER): Payer: Medicare Other | Admitting: Family Medicine

## 2020-07-20 ENCOUNTER — Other Ambulatory Visit: Payer: Self-pay

## 2020-07-20 VITALS — BP 136/86 | HR 79 | Temp 97.6°F | Resp 16 | Ht 68.0 in | Wt 176.9 lb

## 2020-07-20 DIAGNOSIS — Z1211 Encounter for screening for malignant neoplasm of colon: Secondary | ICD-10-CM | POA: Diagnosis not present

## 2020-07-20 DIAGNOSIS — I10 Essential (primary) hypertension: Secondary | ICD-10-CM | POA: Diagnosis not present

## 2020-07-20 DIAGNOSIS — I2510 Atherosclerotic heart disease of native coronary artery without angina pectoris: Secondary | ICD-10-CM | POA: Diagnosis not present

## 2020-07-20 DIAGNOSIS — I252 Old myocardial infarction: Secondary | ICD-10-CM

## 2020-07-20 DIAGNOSIS — Z8739 Personal history of other diseases of the musculoskeletal system and connective tissue: Secondary | ICD-10-CM

## 2020-07-20 DIAGNOSIS — F331 Major depressive disorder, recurrent, moderate: Secondary | ICD-10-CM

## 2020-07-20 MED ORDER — ATORVASTATIN CALCIUM 80 MG PO TABS: 80.0000 mg | ORAL_TABLET | Freq: Every day | ORAL | 1 refills | Status: DC

## 2020-07-20 MED ORDER — ALLOPURINOL 100 MG PO TABS: 100.0000 mg | ORAL_TABLET | Freq: Two times a day (BID) | ORAL | 1 refills | Status: DC

## 2020-07-20 MED ORDER — CITALOPRAM HYDROBROMIDE 20 MG PO TABS: 20.0000 mg | ORAL_TABLET | Freq: Every day | ORAL | 1 refills | Status: DC

## 2020-07-20 MED ORDER — METOPROLOL SUCCINATE ER 25 MG PO TB24: 25.0000 mg | ORAL_TABLET | Freq: Every day | ORAL | 1 refills | Status: DC

## 2020-07-20 NOTE — Patient Instructions (Signed)
[employer] [place] [employer] [place] [address] [place]

## 2020-07-29 ENCOUNTER — Ambulatory Visit: Payer: Medicare Other

## 2020-07-29 ENCOUNTER — Telehealth: Payer: Self-pay

## 2020-07-29 NOTE — Telephone Encounter (Signed)
Patient came to office this morning for colonoscopy triage with interpreter.  He stated that he has been experiencing heartburn, and thought that's why he was referred to [place] to have colonoscopy.   I explained to patient that he would need to have an upper endoscopy scheduled to look for the cause of his heartburn, which requires an office visit with the physician to evaluate. Patient agreed with scheduling an office visit to have his heartburn evaluated.  He currently takes an OTC antacid. Today's appt was canceled with nurse to schedule office visit.  States his last colonoscopy was performed about 6-7 years ago at [facility] in [facility]..  He stated that his colonoscopy was normal.  Office visit has been scheduled with Dr. Sol Blazing 09/15/20 at 10am.  Medications were reviewed and updated.  Patient is being followed by Cardiology Dr. Okey Dupre, and Gillian Shields, NP for CAD. Blood thinner: Aspirin 81mg  daily.  Thank you,  , CMA

## 2020-08-16 NOTE — Progress Notes (Addendum)
Office Visit    Patient Name: Marcus Gray Date of Encounter: 08/24/2020  Primary Care Provider:  Steele Sizer, MD Primary Cardiologist:  Nelva Bush, MD Electrophysiologist:  None   Chief Complaint    [facility] is a 65 y.o. male with a hx ofcoronary artery disease s/p DES to RCA and OM1 in 2015, ischemic cardiomyopathy (EF 45% 2015), hx of alcohol use, thrombocytopenia, HTN, HLD, arthritis presents today for follow-up of blood pressure.   Past Medical History    Past Medical History:  Diagnosis Date  . Abnormal liver function test   . Arthritis   . CAD in native artery   . Coronary artery disease 06/19/2014   (a) s/p DES to RCA (Promus 2.25x28) and DES to OM1 (2.5x20) 06/19/14 via right radial artery at [facility] [place]  . Dyslipidemia   . History of alcohol use   . History of myocardial infarction   . Hypertension   . Ischemic cardiomyopathy 05/2014   (a) 06/18/14 echo EF 45%, mild concentric LVH, nhypokinesis, moderate MR, moderate TR  . MI (myocardial infarction) ([place])   . Thrombocytopenia ([place])    Past Surgical History:  Procedure Laterality Date  . CORONARY STENT PLACEMENT  2016   [place]; OM1 and RCA    Allergies  No Known Allergies  History of Present Illness    Marcus Gray is a 65 y.o. male with a history of coronary artery disease s/p DES to RCA and OM1 in 2015, ischemic cardiomyopathy (EF 45% 2015), hx of alcohol use, thrombocytopenia, HTN, HLD, arthritis last seen 07/07/20.  He was admitted to [facility] 06/18/14 - 06/20/14.  Records reviewed.  He was referred to the ED at that time for abnormal liver test and concern for Tylenol toxicity and empirically treated with N acetylcysteine.  CXR showed vascular congestion.  Echocardiogram 06/18/14 mild concentric LVH, LVEF 45%, inferior wall severe hypokinesis, basal posterior lateral wall hypokinesis, mild LA enlargement, moderate MR without stenosis,  trileaflet aortic valve, moderate TR, PASP 30 mmHg, small pericardial efusion that was circumferential.  Due to wall motion abnormalities he was evaluated by cardiology.  He had a left heart cath 06/19/2014 via the right radial artery.  He underwent balloon angioplasty and DES to RCA (Promus 2.25x28) which was 100% occluded and OM1 (2.5x20).  Morning.   Evaluated cardiology and underwent cardiac catheterization 06/19/2020 with DES to RCA and OM1.  He was discharged on aspirin, atorvastatin, Plavix, Lasix, lisinopril, metoprolol tartrate.  LDL during admission was 76.  He reports a history of MI in 2016 with PCI to OM1 and mid RCA at that time and [place], [place]. His previous anginal equivalent was not chest pain but instead feeling hot all over.  He was seen in the [facility] ED June 2021 complaining of chest pain radiating to back. CTA chest and high-sensitivity troponin unrevealing leading to diagnosis of bronchitis.  He was to establish cardiology care by Dr. Saunders Revel 06/16/2020. He noted no anginal symptoms and was feeling overall well since his hospitalization June 2021. His lisinopril had been increased to 20 mg daily due to elevated blood pressure by PCP in September. Atorvastatin was also added for elevated LDL. When seen by Dr. Saunders Revel he was noted to have elevated blood pressure and his lisinopril was increased to 40 mg daily and metoprolol succinate continued at 25 mg daily. At follow up 07/07/20 his BP was mildly elevated at 140/86 though he had not yet taken his medications. He reported home  blood pressure readings at goal and his medications were continued. He had lipid panel with LDL not at goal of <70, recommended to increase Atorvastatin to 80mg  daily. Unfortunately he was unable to be reached with these results to make changes.  Presents today for follow-up.  Reports no chest pain, pressure, tightness.  Reports no shortness of breath or dyspnea on exertion.  Reports blood pressure at home routinely 130s to  140s.  We discussed blood pressure goal of less than 130/80.  He takes his blood pressure medications at lunch every day and denies missed doses.  EKGs/Labs/Other Studies Reviewed:   The following studies were reviewed today:   EKG:  EKG is  ordered today.  The ekg ordered today demonstrates sinus rhythm 60 bpm with stable prior inferior infarct.  No acute changes.  Recent Labs: 01/09/2020: Hemoglobin 16.4; Platelets 141 07/07/2020: ALT 37; BUN 8; Creatinine, Ser 0.80; Potassium 4.3; Sodium 141  Recent Lipid Panel    Component Value Date/Time   CHOL 190 07/07/2020 1018   TRIG 219 (H) 07/07/2020 1018   HDL 47 07/07/2020 1018   CHOLHDL 4.0 07/07/2020 1018   CHOLHDL 4.6 04/20/2020 1630   LDLCALC 105 (H) 07/07/2020 1018   LDLCALC 108 (H) 04/20/2020 1630   Home Medications   Current Meds  Medication Sig  . allopurinol (ZYLOPRIM) 100 MG tablet Take 1 tablet (100 mg total) by mouth 2 (two) times daily.  Marland Kitchen amLODipine (NORVASC) 5 MG tablet Take 1 tablet (5 mg total) by mouth daily.  Marland Kitchen aspirin EC 81 MG tablet Take 1 tablet (81 mg total) by mouth daily.  Marland Kitchen atorvastatin (LIPITOR) 80 MG tablet Take 1 tablet (80 mg total) by mouth daily.  . citalopram (CELEXA) 20 MG tablet Take 1 tablet (20 mg total) by mouth daily.  . colchicine 0.6 MG tablet Take 2 tablets (1.2 mg total) by mouth daily as needed. And may repeat in 24 hours if no improvement, after that may stop medication and take prn  . lisinopril (ZESTRIL) 40 MG tablet Take 1 tablet (40 mg total) by mouth daily.  . metoprolol succinate (TOPROL-XL) 25 MG 24 hr tablet Take 1 tablet (25 mg total) by mouth daily.     Review of Systems   Review of Systems  Constitutional: Negative for chills, fever and malaise/fatigue.  Cardiovascular: Negative for chest pain, dyspnea on exertion, leg swelling, near-syncope, orthopnea, palpitations and syncope.  Respiratory: Negative for cough, shortness of breath and wheezing.   Gastrointestinal: Negative  for nausea and vomiting.  Neurological: Negative for dizziness, light-headedness and weakness.   All other systems reviewed and are otherwise negative except as noted above.  Physical Exam    VS:  BP (!) 172/100 (BP Location: Left Arm, Patient Position: Sitting, Cuff Size: Normal)   Pulse 60   Wt 173 lb (78.5 kg)   SpO2 98%   BMI 26.30 kg/m  , BMI Body mass index is 26.3 kg/m.  Wt Readings from Last 3 Encounters:  08/24/20 173 lb (78.5 kg)  07/20/20 176 lb 14.4 oz (80.2 kg)  07/07/20 177 lb (80.3 kg)    GEN: Well nourished, well developed, in no acute distress. HEENT: normal. Neck: Supple, no JVD, carotid bruits, or masses. Cardiac: RRR, no murmurs, rubs, or gallops. No clubbing, cyanosis, edema.  Radials/DP/PT 2+ and equal bilaterally.  Respiratory:  Respirations regular and unlabored, clear to auscultation bilaterally. GI: Soft, nontender, nondistended. MS: No deformity or atrophy. Skin: Warm and dry, no rash. Neuro:  Strength and  sensation are intact. Psych: Normal affect.  Assessment & Plan    1. Coronary artery disease / Ischemic cardiomyopathy - s/p DES to RCA and OM1 in 2016.  Echo at that time with LVEF 45%.  Euvolemic, well compensated on exam.  GDMT includes aspirin, statin, metoprolol. Update echocardiogram for reassessment of LVEF.   2. HTN-BP markedly elevated today systolic with 28J even on my recheck.  At primary care office 1 month ago BP 136/86.  Reports home BP routinely 130s to 140s.  Continue lisinopril 40 mg daily.  Start amlodipine 5 mg daily. If BP not at goal of <130/80 at follow up consider increasing to 10mg  daily.  3. HLD- Lipid panel 07/07/2020 with LDL 105.  LDL goal less than 70.  His atorvastatin was increased to 80 mg daily 07/21/2019 by his primary care provider.   Disposition: Follow up  in 2 month(s) with Dr. Saunders Revel or APP  Signed, Loel Dubonnet, NP 08/24/2020, 12:19 PM [place]

## 2020-08-24 ENCOUNTER — Ambulatory Visit: Payer: Medicare Other | Admitting: Family

## 2020-08-24 ENCOUNTER — Encounter: Payer: Self-pay | Admitting: Family

## 2020-08-24 ENCOUNTER — Other Ambulatory Visit: Payer: Self-pay

## 2020-08-24 VITALS — BP 172/100 | HR 60 | Ht 68.0 in | Wt 173.0 lb

## 2020-08-24 DIAGNOSIS — E785 Hyperlipidemia, unspecified: Secondary | ICD-10-CM | POA: Diagnosis not present

## 2020-08-24 DIAGNOSIS — I255 Ischemic cardiomyopathy: Secondary | ICD-10-CM | POA: Diagnosis not present

## 2020-08-24 DIAGNOSIS — I10 Essential (primary) hypertension: Secondary | ICD-10-CM

## 2020-08-24 DIAGNOSIS — I2510 Atherosclerotic heart disease of native coronary artery without angina pectoris: Secondary | ICD-10-CM

## 2020-08-24 MED ORDER — AMLODIPINE BESYLATE 5 MG PO TABS: 5.0000 mg | ORAL_TABLET | Freq: Every day | ORAL | 1 refills | Status: DC

## 2020-08-24 NOTE — Patient Instructions (Addendum)
Medication Instructions:  Your physician has recommended you make the following change in your medication:   START Amlodipine 5mg  daily  *If you need a refill on your cardiac medications before your next appointment, please call your pharmacy*  Lab Work: None ordered today  Testing/Procedures: Your physician has requested that you have an echocardiogram. Echocardiography is a painless test that uses sound waves to create images of your heart. It provides your doctor with information about the size and shape of your heart and how well your heart's chambers and valves are working. This procedure takes approximately one hour. There are no restrictions for this procedure.  Follow-Up: At [facility], you and your health needs are our priority.  As part of our continuing mission to provide you with exceptional heart care, we have created designated Provider Care Teams.  These Care Teams include your primary Cardiologist (physician) and Advanced Practice Providers (APPs -  Physician Assistants and Nurse Practitioners) who all work together to provide you with the care you need, when you need it.  We recommend signing up for the patient portal called "MyChart".  Sign up information is provided on this After Visit Summary.  MyChart is used to connect with patients for Virtual Visits (Telemedicine).  Patients are able to view lab/test results, encounter notes, upcoming appointments, etc.  Non-urgent messages can be sent to your provider as well.   To learn more about what you can do with MyChart, go to [email or web address].    Your next appointment:   2 month(s)  The format for your next appointment:   In Person  Provider:   You may see Nelva Bush, MD or one of the following Advanced Practice Providers on your designated Care Team:    Murray Hodgkins, NP  Christell Faith, PA-C  Marrianne Mood, PA-C  Cadence Hardin, [place]  Laurann Montana, NP

## 2020-08-27 ENCOUNTER — Ambulatory Visit (INDEPENDENT_AMBULATORY_CARE_PROVIDER_SITE_OTHER): Payer: Medicare Other

## 2020-08-27 ENCOUNTER — Other Ambulatory Visit: Payer: Self-pay

## 2020-08-27 DIAGNOSIS — I10 Essential (primary) hypertension: Secondary | ICD-10-CM

## 2020-08-27 DIAGNOSIS — I255 Ischemic cardiomyopathy: Secondary | ICD-10-CM

## 2020-08-27 LAB — ECHOCARDIOGRAM COMPLETE
AR max vel: 3.06 cm2
AV Area VTI: 2.88 cm2
AV Area mean vel: 2.98 cm2
AV Mean grad: 4 mmHg
AV Peak grad: 7.5 mmHg
Ao pk vel: 1.37 m/s
Area-P 1/2: 1.78 cm2
Calc EF: 47.1 %
S' Lateral: 4.95 cm
Single Plane A2C EF: 46.8 %
Single Plane A4C EF: 43.2 %

## 2020-09-06 ENCOUNTER — Telehealth: Payer: Self-pay | Admitting: Family Medicine

## 2020-09-06 NOTE — Telephone Encounter (Signed)
Copied from [place] [id number]. Topic: Medicare AWV >> Sep 06, 2020 11:43 AM Cher Nakai R wrote: Reason for CRM:   No answer unable to leave a message for patient to call back and schedule Medicare Annual Wellness Visit (AWV) in office.   If not able to come in the office, please offer to do virtually or by telephone.   No hx of AWV - AWV-I eligible as of 06/30/2020  Please schedule at anytime with [facility] Health Advisor.   40 minute appointment  Any questions, please contact me at [phone]

## 2020-09-07 ENCOUNTER — Telehealth: Payer: Self-pay | Admitting: *Deleted

## 2020-09-07 NOTE — Telephone Encounter (Signed)
Attempted to contact pt via [place] interepreter [phone]. Pt did not answer. Lmtcb regarding results.

## 2020-09-07 NOTE — Telephone Encounter (Signed)
-----   Message from Loel Dubonnet, NP sent at 08/30/2020  7:33 AM EST ----- Heart pumping function 4-45% which is mildly decreased though stable compared to previous. Heart is mildly stiff. Mild mitral valve leakiness. Overall stable result. Continue current medications.

## 2020-09-09 ENCOUNTER — Ambulatory Visit: Payer: Medicare Other | Admitting: Gastroenterology

## 2020-09-09 NOTE — Telephone Encounter (Signed)
Attempted to call pt, third attempt with no answer, using [place] interpreter ID # [phone]. No answer. Message was left asking pt to call back. After multiple attempts, we have been unable to reach pt.

## 2020-09-15 ENCOUNTER — Encounter: Payer: Self-pay | Admitting: Gastroenterology

## 2020-09-15 ENCOUNTER — Other Ambulatory Visit: Payer: Self-pay

## 2020-09-15 ENCOUNTER — Ambulatory Visit: Payer: Medicare Other | Admitting: Gastroenterology

## 2020-09-15 VITALS — BP 184/105 | HR 53 | Ht 68.0 in | Wt 178.8 lb

## 2020-09-15 DIAGNOSIS — Z1211 Encounter for screening for malignant neoplasm of colon: Secondary | ICD-10-CM | POA: Diagnosis not present

## 2020-09-15 DIAGNOSIS — K219 Gastro-esophageal reflux disease without esophagitis: Secondary | ICD-10-CM

## 2020-09-15 MED ORDER — FAMOTIDINE 40 MG PO TABS: 40.0000 mg | ORAL_TABLET | Freq: Every day | ORAL | 2 refills | Status: DC

## 2020-09-15 MED ORDER — PEG 3350-KCL-NA BICARB-NACL 420 G PO SOLR: 4000.0000 mL | Freq: Once | ORAL | 0 refills | Status: AC

## 2020-09-15 MED ORDER — PEG 3350-KCL-NA BICARB-NACL 420 G PO SOLR: 4000.0000 mL | Freq: Once | ORAL | 0 refills | Status: DC

## 2020-09-15 NOTE — Patient Instructions (Addendum)
Gastroesophageal Reflux Disease, Adult  Gastroesophageal reflux (GER) happens when acid from the stomach flows up into the tube that connects the mouth and the stomach (esophagus). Normally, food travels down the esophagus and stays in the stomach to be digested. With GER, food and stomach acid sometimes move back up into the esophagus. You may have a disease called gastroesophageal reflux disease (GERD) if the reflux:  Happens often.  Causes frequent or very bad symptoms.  Causes problems such as damage to the esophagus. When this happens, the esophagus becomes sore and swollen. Over time, GERD can make small holes (ulcers) in the lining of the esophagus. What are the causes? This condition is caused by a problem with the muscle between the esophagus and the stomach. When this muscle is weak or not normal, it does not close properly to keep food and acid from coming back up from the stomach. The muscle can be weak because of:  Tobacco use.  Pregnancy.  Having a certain type of hernia (hiatal hernia).  Alcohol use.  Certain foods and drinks, such as coffee, chocolate, onions, and peppermint. What increases the risk?  Being overweight.  Having a disease that affects your connective tissue.  Taking NSAIDs, such a ibuprofen. What are the signs or symptoms?  Heartburn.  Difficult or painful swallowing.  The feeling of having a lump in the throat.  A bitter taste in the mouth.  Bad breath.  Having a lot of saliva.  Having an upset or bloated stomach.  Burping.  Chest pain. Different conditions can cause chest pain. Make sure you see your doctor if you have chest pain.  Shortness of breath or wheezing.  A long-term cough or a cough at night.  Wearing away of the surface of teeth (tooth enamel).  Weight loss. How is this treated?  Making changes to your diet.  Taking medicine.  Having surgery. Treatment will depend on how bad your symptoms are. Follow these  instructions at home: Eating and drinking  Follow a diet as told by your doctor. You may need to avoid foods and drinks such as: ? Coffee and tea, with or without caffeine. ? Drinks that contain alcohol. ? Energy drinks and sports drinks. ? Bubbly (carbonated) drinks or sodas. ? Chocolate and cocoa. ? Peppermint and mint flavorings. ? Garlic and onions. ? Horseradish. ? Spicy and acidic foods. These include peppers, chili powder, curry powder, vinegar, hot sauces, and BBQ sauce. ? Citrus fruit juices and citrus fruits, such as oranges, lemons, and limes. ? Tomato-based foods. These include red sauce, chili, salsa, and pizza with red sauce. ? Fried and fatty foods. These include donuts, french fries, potato chips, and high-fat dressings. ? High-fat meats. These include hot dogs, rib eye steak, sausage, ham, and bacon. ? High-fat dairy items, such as whole milk, butter, and cream cheese.  Eat small meals often. Avoid eating large meals.  Avoid drinking large amounts of liquid with your meals.  Avoid eating meals during the 2-3 hours before bedtime.  Avoid lying down right after you eat.  Do not exercise right after you eat.   Lifestyle  Do not smoke or use any products that contain nicotine or tobacco. If you need help quitting, ask your doctor.  Try to lower your stress. If you need help doing this, ask your doctor.  If you are overweight, lose an amount of weight that is healthy for you. Ask your doctor about a safe weight loss goal.   General instructions  Pay attention to any changes in your symptoms.  Take over-the-counter and prescription medicines only as told by your doctor.  Do not take aspirin, ibuprofen, or other NSAIDs unless your doctor says it is okay.  Wear loose clothes. Do not wear anything tight around your waist.  Raise (elevate) the head of your bed about 6 inches (15 cm). You may need to use a wedge to do this.  Avoid bending over if this makes your  symptoms worse.  Keep all follow-up visits. Contact a doctor if:  You have new symptoms.  You lose weight and you do not know why.  You have trouble swallowing or it hurts to swallow.  You have wheezing or a cough that keeps happening.  You have a hoarse voice.  Your symptoms do not get better with treatment. Get help right away if:  You have sudden pain in your arms, neck, jaw, teeth, or back.  You suddenly feel sweaty, dizzy, or light-headed.  You have chest pain or shortness of breath.  You vomit and the vomit is green, yellow, or black, or it looks like blood or coffee grounds.  You faint.  Your poop (stool) is red, bloody, or black.  You cannot swallow, drink, or eat. These symptoms may represent a serious problem that is an emergency. Do not wait to see if the symptoms will go away. Get medical help right away. Call your local emergency services (911 in the [place] Do not drive yourself to the hospital. Summary  If a person has gastroesophageal reflux disease (GERD), food and stomach acid move back up into the esophagus and cause symptoms or problems such as damage to the esophagus.  Treatment will depend on how bad your symptoms are.  Follow a diet as told by your doctor.  Take all medicines only as told by your doctor. This information is not intended to replace advice given to you by your health care provider. Make sure you discuss any questions you have with your health care provider. Document Revised: 01/26/2020 Document Reviewed: 01/26/2020 Elsevier Patient Education  [place].

## 2020-09-15 NOTE — Progress Notes (Signed)
Marcus Bellows MD, MRCP(U.K) [address]  [place]  [place], [place] [address]  Main: [phone]  Fax: [phone]   Gastroenterology Consultation  Referring Provider:     Steele Sizer, MD Primary Care Physician:  Steele Sizer, MD Primary Gastroenterologist:  Dr. Jonathon Gray  Reason for Consultation:     GERD and colon cancer screening okay I will I will        HPI:   Marcus Gray is a 65 y.o. y/o male referred for consultation & management  by Dr. Ancil Boozer, Drue Stager, MD.    The patient only speaks [place] and the visit was conducted with an in-house interpreter in person. He states that he has had acid reflux for approximately 4 years.  Symptoms are predominantly heartburn usually at night which wakes him up from sleep gets it about 2 times a week.  Not take any medications.  No dysphagia.  No weight loss.  No prior colonoscopy for over 16 years.  No family history of colon cancer or polyps.  No change in bowel habits and no change in shape of his stool.   Past Medical History:  Diagnosis Date  . Abnormal liver function test   . Arthritis   . CAD in native artery   . Coronary artery disease 06/19/2014   (a) s/p DES to RCA (Promus 2.25x28) and DES to OM1 (2.5x20) 06/19/14 via right radial artery at [facility] [place]  . Dyslipidemia   . History of alcohol use   . History of myocardial infarction   . Hypertension   . Ischemic cardiomyopathy 05/2014   (a) 06/18/14 echo EF 45%, mild concentric LVH, nhypokinesis, moderate MR, moderate TR  . MI (myocardial infarction) ([place])   . Thrombocytopenia ([place])     Past Surgical History:  Procedure Laterality Date  . CORONARY STENT PLACEMENT  2016   [place]; OM1 and RCA    Prior to Admission medications   Medication Sig Start Date End Date Taking? Authorizing Provider  allopurinol (ZYLOPRIM) 100 MG tablet Take 1 tablet (100 mg total) by mouth 2 (two) times daily. 07/20/20   Steele Sizer, MD  amLODipine  (NORVASC) 5 MG tablet Take 1 tablet (5 mg total) by mouth daily. 08/24/20 02/20/21  Loel Dubonnet, NP  aspirin EC 81 MG tablet Take 1 tablet (81 mg total) by mouth daily. 04/20/20   Steele Sizer, MD  atorvastatin (LIPITOR) 80 MG tablet Take 1 tablet (80 mg total) by mouth daily. 07/20/20   Steele Sizer, MD  citalopram (CELEXA) 20 MG tablet Take 1 tablet (20 mg total) by mouth daily. 07/20/20   Steele Sizer, MD  colchicine 0.6 MG tablet Take 2 tablets (1.2 mg total) by mouth daily as needed. And may repeat in 24 hours if no improvement, after that may stop medication and take prn 04/20/20   Steele Sizer, MD  lisinopril (ZESTRIL) 40 MG tablet Take 1 tablet (40 mg total) by mouth daily. 06/16/20 09/14/20  End, Harrell Gave, MD  metoprolol succinate (TOPROL-XL) 25 MG 24 hr tablet Take 1 tablet (25 mg total) by mouth daily. 07/20/20   Steele Sizer, MD    Family History  Problem Relation Age of Onset  . Diabetes Mother   . Emphysema Father   . Cerebral aneurysm Daughter      Social History   Tobacco Use  . Smoking status: Current Every Day Smoker    Packs/day: 0.25    Types: Cigarettes    Start date: 04/20/2016  . Smokeless  tobacco: Never Used  Substance Use Topics  . Alcohol use: Yes    Alcohol/week: 5.0 standard drinks    Types: 5 Glasses of wine per week    Comment: wine at night  . Drug use: Never    Allergies as of 09/15/2020  . (No Known Allergies)    Review of Systems:    All systems reviewed and negative except where noted in HPI.   Physical Exam:  There were no vitals taken for this visit. No LMP for male patient. Psych:  Alert and cooperative. Normal mood and affect. General:   Alert,  Well-developed, well-nourished, pleasant and cooperative in NAD Head:  Normocephalic and atraumatic. Eyes:  Sclera clear, no icterus.   Conjunctiva pink. Ears:  Normal auditory acuity. Nose:  No deformity, discharge, or lesions. Mouth:  No deformity or lesions,oropharynx  pink & moist. Neck:  Supple; no masses or thyromegaly. Lungs:  Respirations even and unlabored.  Clear throughout to auscultation.   No wheezes, crackles, or rhonchi. No acute distress. Heart:  Regular rate and rhythm; no murmurs, clicks, rubs, or gallops. Abdomen:  Normal bowel sounds.  No bruits.  Soft, non-tender and non-distended without masses, hepatosplenomegaly or hernias noted.  No guarding or rebound tenderness.    Msk:  Symmetrical without gross deformities. Good, equal movement & strength bilaterally. Pulses:  Normal pulses noted. Extremities:  No clubbing or edema.  No cyanosis. Neurologic:  Alert and oriented x3;  grossly normal neurologically. Skin:  Intact without significant lesions or rashes. No jaundice. Lymph Nodes:  No significant cervical adenopathy. Psych:  Alert and cooperative. Normal mood and affect.  Imaging Studies: ECHOCARDIOGRAM COMPLETE  Result Date: 08/27/2020    ECHOCARDIOGRAM REPORT   Patient Name:   Marcus Gray Ellery Date of Exam: 08/27/2020 Medical Rec #:  [id number]           Height:       68.0 in Accession #:    [id number]          Weight:       173.0 lb Date of Birth:  July 04, 1956           BSA:          1.922 m Patient Age:    18 years            BP:           158/92 mmHg Patient Gender: M                   HR:           70 bpm. Exam Location:  [place] Procedure: 2D Echo, Cardiac Doppler and Color Doppler Indications:    I25.5 Ischemic cardiomyopathy  History:        Patient has no prior history of Echocardiogram examinations.                 Cardiomyopathy, CAD and Previous Myocardial Infarction; Risk                 Factors:Hypertension, Dyslipidemia and Current Smoker.  Sonographer:    Pilar Jarvis RDMS, RVT, RDCS Referring Phys: [id number] Loel Dubonnet  Sonographer Comments: Patient refused an interpreter. Images are adequate but image quality was affected by his inability to understand how to execute a breath hold. DHM and GLS were not attempted  IMPRESSIONS  1. Left ventricular ejection fraction, by estimation, is 40 to 45%. The left ventricle has mildly decreased function. Hypokinesis of the infeiror wall. Left  ventricular diastolic parameters are consistent with Grade I diastolic dysfunction (impaired relaxation).  2. Right ventricular systolic function is normal. The right ventricular size is normal.  3. The mitral valve is normal in structure. Mild mitral valve regurgitation. No evidence of mitral stenosis. FINDINGS  Left Ventricle: Left ventricular ejection fraction, by estimation, is 40 to 45%. The left ventricle has mildly decreased function. The left ventricle has no regional wall motion abnormalities. The left ventricular internal cavity size was normal in size. There is no left ventricular hypertrophy. Left ventricular diastolic parameters are consistent with Grade I diastolic dysfunction (impaired relaxation). Right Ventricle: The right ventricular size is normal. No increase in right ventricular wall thickness. Right ventricular systolic function is normal. Left Atrium: Left atrial size was normal in size. Right Atrium: Right atrial size was normal in size. Pericardium: There is no evidence of pericardial effusion. Mitral Valve: The mitral valve is normal in structure. Mild mitral valve regurgitation. No evidence of mitral valve stenosis. Tricuspid Valve: The tricuspid valve is normal in structure. Tricuspid valve regurgitation is mild . No evidence of tricuspid stenosis. Aortic Valve: The aortic valve is normal in structure. Aortic valve regurgitation is not visualized. No aortic stenosis is present. Aortic valve mean gradient measures 4.0 mmHg. Aortic valve peak gradient measures 7.5 mmHg. Aortic valve area, by VTI measures 2.88 cm. Pulmonic Valve: The pulmonic valve was normal in structure. Pulmonic valve regurgitation is not visualized. No evidence of pulmonic stenosis. Aorta: The aortic root is normal in size and structure. Venous: The  inferior vena cava is normal in size with greater than 50% respiratory variability, suggesting right atrial pressure of 3 mmHg. IAS/Shunts: No atrial level shunt detected by color flow Doppler.  LEFT VENTRICLE PLAX 2D LVIDd:         5.65 cm      Diastology LVIDs:         4.95 cm      LV e' medial:    6.09 cm/s LV PW:         0.95 cm      LV E/e' medial:  9.3 LV IVS:        1.40 cm      LV e' lateral:   11.30 cm/s LVOT diam:     2.10 cm      LV E/e' lateral: 5.0 LV SV:         76 LV SV Index:   39 LVOT Area:     3.46 cm  LV Volumes (MOD) LV vol d, MOD A2C: 133.0 ml LV vol d, MOD A4C: 133.0 ml LV vol s, MOD A2C: 70.7 ml LV vol s, MOD A4C: 75.5 ml LV SV MOD A2C:     62.3 ml LV SV MOD A4C:     133.0 ml LV SV MOD BP:      65.3 ml RIGHT VENTRICLE             IVC RV Basal diam:  3.30 cm     IVC diam: 1.90 cm RV S prime:     12.40 cm/s TAPSE (M-mode): 2.8 cm LEFT ATRIUM             Index       RIGHT ATRIUM           Index LA diam:        3.80 cm 1.98 cm/m  RA Area:     16.20 cm LA Vol (A2C):   68.8 ml 35.80 ml/m RA Volume:  40.10 ml  20.87 ml/m LA Vol (A4C):   57.3 ml 29.82 ml/m LA Biplane Vol: 65.5 ml 34.09 ml/m  AORTIC VALVE                   PULMONIC VALVE AV Area (Vmax):    3.06 cm    PV Vmax:       0.93 m/s AV Area (Vmean):   2.98 cm    PV Peak grad:  3.5 mmHg AV Area (VTI):     2.88 cm AV Vmax:           137.00 cm/s AV Vmean:          85.400 cm/s AV VTI:            0.263 m AV Peak Grad:      7.5 mmHg AV Mean Grad:      4.0 mmHg LVOT Vmax:         121.00 cm/s LVOT Vmean:        73.500 cm/s LVOT VTI:          0.219 m LVOT/AV VTI ratio: 0.83  AORTA Ao Root diam: 3.40 cm Ao Asc diam:  3.30 cm Ao Arch diam: 2.8 cm MITRAL VALVE               TRICUSPID VALVE MV Area (PHT): 1.78 cm    TR Peak grad:   26.8 mmHg MV Decel Time: 425 msec    TR Vmax:        259.00 cm/s MV E velocity: 56.60 cm/s MV A velocity: 61.80 cm/s  SHUNTS MV E/A ratio:  0.92        Systemic VTI:  0.22 m                            Systemic Diam:  2.10 cm Ida Rogue MD Electronically signed by Ida Rogue MD Signature Date/Time: 08/27/2020/8:36:57 PM    Final     Assessment and Plan:   [facility] Chancy is a 65 y.o. y/o male has been referred for GERD and colon cancer screening average risk  Plan 1.  Discussed lifestyle changes for acid reflux including avoiding food intake for 2 hours before bedtime.  Since his symptoms are mainly nocturnal and occurring 2 times a week I believe we can provide him some relief with Pepcid 40 mg at night before bedtime.  In addition advised him to use a wedge pillow and a picture of the same has been provided.  At his next visit if he is feeling much better can probably try to get him off the Pepcid or trans for him to a lower dose.  2.  Screening colonoscopy average risk  I have discussed alternative options, risks & benefits,  which include, but are not limited to, bleeding, infection, perforation,respiratory complication & drug reaction.  The patient agrees with this plan & written consent will be obtained.     Follow up in 4 months  Dr Marcus Bellows MD,MRCP(U.K)

## 2020-10-05 ENCOUNTER — Other Ambulatory Visit: Payer: Self-pay

## 2020-10-05 ENCOUNTER — Other Ambulatory Visit
Admission: RE | Admit: 2020-10-05 | Discharge: 2020-10-05 | Disposition: A | Discharge status: Home / Self Care | Payer: Medicare Other | Source: Ambulatory Visit | Attending: Gastroenterology | Admitting: Gastroenterology

## 2020-10-05 DIAGNOSIS — Z20822 Contact with and (suspected) exposure to covid-19: Secondary | ICD-10-CM | POA: Insufficient documentation

## 2020-10-05 DIAGNOSIS — Z01812 Encounter for preprocedural laboratory examination: Secondary | ICD-10-CM | POA: Insufficient documentation

## 2020-10-06 ENCOUNTER — Encounter: Payer: Self-pay | Admitting: Gastroenterology

## 2020-10-06 LAB — SARS CORONAVIRUS 2 (TAT 6-24 HRS): SARS Coronavirus 2: NEGATIVE

## 2020-10-07 ENCOUNTER — Ambulatory Visit: Payer: Medicare Other | Admitting: Anesthesiology

## 2020-10-07 ENCOUNTER — Encounter
Admission: RE | Disposition: A | Discharge status: Home / Self Care | Payer: Self-pay | Source: Home / Self Care | Attending: Gastroenterology

## 2020-10-07 ENCOUNTER — Ambulatory Visit
Admission: RE | Admit: 2020-10-07 | Discharge: 2020-10-07 | Disposition: A | Discharge status: Home / Self Care | Payer: Medicare Other | Attending: Gastroenterology | Admitting: Gastroenterology

## 2020-10-07 ENCOUNTER — Encounter: Payer: Self-pay | Admitting: Gastroenterology

## 2020-10-07 DIAGNOSIS — F17210 Nicotine dependence, cigarettes, uncomplicated: Secondary | ICD-10-CM | POA: Diagnosis not present

## 2020-10-07 DIAGNOSIS — Z7982 Long term (current) use of aspirin: Secondary | ICD-10-CM | POA: Insufficient documentation

## 2020-10-07 DIAGNOSIS — Z1211 Encounter for screening for malignant neoplasm of colon: Secondary | ICD-10-CM

## 2020-10-07 DIAGNOSIS — K640 First degree hemorrhoids: Secondary | ICD-10-CM | POA: Insufficient documentation

## 2020-10-07 DIAGNOSIS — K635 Polyp of colon: Secondary | ICD-10-CM

## 2020-10-07 DIAGNOSIS — Z955 Presence of coronary angioplasty implant and graft: Secondary | ICD-10-CM | POA: Insufficient documentation

## 2020-10-07 DIAGNOSIS — D124 Benign neoplasm of descending colon: Secondary | ICD-10-CM | POA: Diagnosis not present

## 2020-10-07 DIAGNOSIS — Z79899 Other long term (current) drug therapy: Secondary | ICD-10-CM | POA: Insufficient documentation

## 2020-10-07 HISTORY — PX: COLONOSCOPY WITH PROPOFOL: SHX5780

## 2020-10-07 HISTORY — DX: Type 2 diabetes mellitus without complications: E11.9

## 2020-10-07 HISTORY — DX: Angina pectoris, unspecified: I20.9

## 2020-10-07 SURGERY — COLONOSCOPY WITH PROPOFOL
Anesthesia: General

## 2020-10-07 MED ORDER — DEXMEDETOMIDINE HCL 200 MCG/2ML IV SOLN
INTRAVENOUS | Status: DC | PRN
  Administered 2020-10-07: 8 ug via INTRAVENOUS
  Administered 2020-10-07: 12 ug via INTRAVENOUS

## 2020-10-07 MED ORDER — PROPOFOL 10 MG/ML IV BOLUS
INTRAVENOUS | Status: DC | PRN
  Administered 2020-10-07: 70 mg via INTRAVENOUS

## 2020-10-07 MED ORDER — SODIUM CHLORIDE 0.9 % IV SOLN: INTRAVENOUS | Status: DC

## 2020-10-07 MED ORDER — PROPOFOL 500 MG/50ML IV EMUL
INTRAVENOUS | Status: DC | PRN
  Administered 2020-10-07: 140 ug/kg/min via INTRAVENOUS

## 2020-10-07 NOTE — Anesthesia Preprocedure Evaluation (Signed)
Anesthesia Evaluation  Patient identified by MRN, date of birth, ID band Patient awake    Reviewed: Allergy & Precautions, H&P , NPO status , Patient's Chart, lab work & pertinent test results, reviewed documented beta blocker date and time   History of Anesthesia Complications Negative for: history of anesthetic complications  Airway Mallampati: II  TM Distance: >3 FB Neck ROM: full    Dental  (+) Dental Advidsory Given, Teeth Intact, Missing   Pulmonary neg shortness of breath, neg COPD, neg recent URI, Current Smoker,    Pulmonary exam normal breath sounds clear to auscultation       Cardiovascular Exercise Tolerance: Good hypertension, (-) angina+ CAD, + Past MI and + Cardiac Stents  Normal cardiovascular exam(-) dysrhythmias (-) Valvular Problems/Murmurs Rhythm:regular Rate:Normal     Neuro/Psych negative neurological ROS  negative psych ROS   GI/Hepatic Neg liver ROS, GERD  ,  Endo/Other  negative endocrine ROS  Renal/GU negative Renal ROS  negative genitourinary   Musculoskeletal   Abdominal   Peds  Hematology negative hematology ROS (+)   Anesthesia Other Findings Past Medical History: No date: Abnormal liver function test No date: Anginal pain (HCC) No date: Arthritis No date: CAD in native artery 06/19/2014: Coronary artery disease     Comment:  (a) s/p DES to RCA (Promus 2.25x28) and DES to OM1               (2.5x20) 06/19/14 via right radial artery at [facility] [place] No date: Diabetes mellitus without complication (HCC) No date: Dyslipidemia No date: History of alcohol use No date: History of myocardial infarction No date: Hypertension 05/2014: Ischemic cardiomyopathy     Comment:  (a) 06/18/14 echo EF 45%, mild concentric LVH,               nhypokinesis, moderate MR, moderate TR No date: MI (myocardial infarction) ([place]) No date: Thrombocytopenia (HCC)    Reproductive/Obstetrics negative OB ROS                             Anesthesia Physical Anesthesia Plan  ASA: III  Anesthesia Plan: General   Post-op Pain Management:    Induction: Intravenous  PONV Risk Score and Plan: 1 and TIVA and Propofol infusion  Airway Management Planned: Natural Airway and Nasal Cannula  Additional Equipment:   Intra-op Plan:   Post-operative Plan:   Informed Consent: I have reviewed the patients History and Physical, chart, labs and discussed the procedure including the risks, benefits and alternatives for the proposed anesthesia with the patient or authorized representative who has indicated his/her understanding and acceptance.     Dental Advisory Given  Plan Discussed with: Anesthesiologist, CRNA and Surgeon  Anesthesia Plan Comments:         Anesthesia Quick Evaluation

## 2020-10-07 NOTE — Op Note (Signed)
[facility] Gastroenterology Patient Name: Marcus Gray Procedure Date: 10/07/2020 10:40 AM MRN: [id number] Account #: [id number] Date of Birth: [birth year] Admit Type: Outpatient Age: 65 Room: [facility] ENDO ROOM 3 Gender: Male Note Status: Finalized Procedure:             Colonoscopy Indications:           Screening for colorectal malignant neoplasm Providers:             [name] MD, MD Referring MD:          Bethena Roys. Sowles, MD (Referring MD) Medicines:             Monitored Anesthesia Care Complications:         No immediate complications. Procedure:             Pre-Anesthesia Assessment:                        - Prior to the procedure, a History and Physical was                         performed, and patient medications, allergies and                         sensitivities were reviewed. [name] tolerance                         of previous anesthesia was reviewed.                        - The risks and benefits of the procedure and the                         sedation options and risks were discussed with the                         patient. All questions were answered and informed                         consent was obtained.                        - ASA Grade Assessment: II - A patient with mild                         systemic disease.                        After obtaining informed consent, the colonoscope was                         passed under direct vision. Throughout the procedure,                         the patient's blood pressure, pulse, and oxygen                         saturations were monitored continuously. The                         Colonoscope was introduced through the anus  and                         advanced to the the cecum, identified by the                         appendiceal orifice. The colonoscopy was performed                         with ease. The patient tolerated the procedure well.                         The quality of  the bowel preparation was excellent. Findings:      The perianal and digital rectal examinations were normal.      A 5 mm polyp was found in the descending colon. The polyp was sessile.       The polyp was removed with a cold snare. Resection and retrieval were       complete.      Non-bleeding internal hemorrhoids were found during retroflexion. The       hemorrhoids were medium-sized and Grade I (internal hemorrhoids that do       not prolapse).      The exam was otherwise without abnormality on direct and retroflexion       views. Impression:            - One 5 mm polyp in the descending colon, removed with                         a cold snare. Resected and retrieved.                        - Non-bleeding internal hemorrhoids.                        - The examination was otherwise normal on direct and                         retroflexion views. Recommendation:        - Discharge patient to home (with escort).                        - Resume previous diet.                        - Continue present medications.                        - Await pathology results.                        - Repeat colonoscopy for surveillance based on                         pathology results. Procedure Code(s):     --- Professional ---                        [phone], Colonoscopy, flexible; with removal of                         tumor(s), polyp(s), or  other lesion(s) by snare                         technique Diagnosis Code(s):     --- Professional ---                        Z12.11, Encounter for screening for malignant neoplasm                         of colon                        K63.5, Polyp of colon                        K64.0, First degree hemorrhoids CPT copyright 2019 American Medical Association. All rights reserved. The codes documented in this report are preliminary and upon coder review may  be revised to meet current compliance requirements. [name], MD [name] MD, MD 10/07/2020  11:11:01 AM This report has been signed electronically. Number of Addenda: 0 Note Initiated On: 10/07/2020 10:40 AM Scope Withdrawal Time: 0 hours 10 minutes 5 seconds  Total Procedure Duration: 0 hours 11 minutes 51 seconds  Estimated Blood Loss:  Estimated blood loss: none.      [facility]

## 2020-10-07 NOTE — H&P (Signed)
Marcus Bellows, MD [address], [place], [place], [place], [address] [address], [place], [place], [place], [address] Phone: [phone]  Fax: [phone]  Primary Care Physician:  Marcus Sizer, MD   Pre-Procedure History & Physical: HPI:  Marcus Gray is a 65 y.o. male is here for an colonoscopy.   Past Medical History:  Diagnosis Date  . Abnormal liver function test   . Anginal pain ([place])   . Arthritis   . CAD in native artery   . Coronary artery disease 06/19/2014   (a) s/p DES to RCA (Promus 2.25x28) and DES to OM1 (2.5x20) 06/19/14 via [facility] radial artery at [facility] [place]  . Diabetes mellitus without complication ([place])   . Dyslipidemia   . History of alcohol use   . History of myocardial infarction   . Hypertension   . Ischemic cardiomyopathy 05/2014   (a) 06/18/14 echo EF 45%, mild concentric LVH, nhypokinesis, moderate MR, moderate TR  . MI (myocardial infarction) ([place])   . Thrombocytopenia ([place])     Past Surgical History:  Procedure Laterality Date  . CARDIAC CATHETERIZATION    . CORONARY STENT PLACEMENT  2016   [place]; OM1 and RCA    Prior to Admission medications   Medication Sig Start Date Gray Date Taking? Authorizing Provider  lisinopril (ZESTRIL) 40 MG tablet Take 1 tablet (40 mg total) by mouth daily. 06/16/20 09/14/20 Yes Gray, Marcus Gave, MD  metoprolol succinate (TOPROL-XL) 25 MG 24 hr tablet Take 1 tablet (25 mg total) by mouth daily. 07/20/20  Yes Gray, Marcus Stager, MD  allopurinol (ZYLOPRIM) 100 MG tablet Take 1 tablet (100 mg total) by mouth 2 (two) times daily. 07/20/20   Marcus Sizer, MD  amLODipine (NORVASC) 5 MG tablet Take 1 tablet (5 mg total) by mouth daily. 08/24/20 02/20/21  Marcus Dubonnet, NP  aspirin EC 81 MG tablet Take 1 tablet (81 mg total) by mouth daily. 04/20/20   Marcus Sizer, MD  atorvastatin (LIPITOR) 80 MG tablet Take 1 tablet (80 mg total) by mouth daily. 07/20/20   Marcus Sizer, MD   citalopram (CELEXA) 20 MG tablet Take 1 tablet (20 mg total) by mouth daily. 07/20/20   Marcus Sizer, MD  colchicine 0.6 MG tablet Take 2 tablets (1.2 mg total) by mouth daily as needed. And may repeat in 24 hours if no improvement, after that may stop medication and take prn 04/20/20   Marcus Sizer, MD  famotidine (PEPCID) 40 MG tablet Take 1 tablet (40 mg total) by mouth daily. 09/15/20 12/13/20  Marcus Bellows, MD    Allergies as of 09/15/2020  . (No Known Allergies)    Family History  Problem Relation Age of Onset  . Diabetes Mother   . Emphysema Father   . Cerebral aneurysm Daughter     Social History   Socioeconomic History  . Marital status: Married    Spouse name: Marcus Gray   . Number of children: 2  . Years of education: Not on file  . Highest education level: Some college, no degree  Occupational History  . Occupation: retired     Comment: Nature conservation officer   Tobacco Use  . Smoking status: Current Every Day Smoker    Packs/day: 0.25    Types: Cigarettes    Start date: 04/20/2016  . Smokeless tobacco: Never Used  Vaping Use  . Vaping Use: Never used  Substance and Sexual Activity  . Alcohol use: Yes    Alcohol/week: 5.0 standard  drinks    Types: 5 Glasses of wine per week    Comment: wine at night  . Drug use: Never  . Sexual activity: Not on file  Other Topics Concern  . Not on file  Social History Narrative   ** Merged History Encounter **       Born in [place], has two children Moved to [place] in 2000    Social Determinants of Health   Financial Resource Strain: Low Risk   . Difficulty of Paying Living Expenses: Not hard at all  Food Insecurity: No Food Insecurity  . Worried About Charity fundraiser in the Last Year: Never true  . Ran Out of Food in the Last Year: Never true  Transportation Needs: No Transportation Needs  . Lack of Transportation (Medical): No  . Lack of Transportation (Non-Medical): No  Physical Activity: Insufficiently  Active  . Days of Exercise per Week: 1 day  . Minutes of Exercise per Session: 60 min  Stress: Stress Concern Present  . Feeling of Stress : Rather much  Social Connections: Socially Integrated  . Frequency of Communication with Friends and Family: More than three times a week  . Frequency of Social Gatherings with Friends and Family: Twice a week  . Attends Religious Services: More than 4 times per year  . Active Member of Clubs or Organizations: Yes  . Attends Archivist Meetings: More than 4 times per year  . Marital Status: Married  Human resources officer Violence: Not At Risk  . Fear of Current or Ex-Partner: No  . Emotionally Abused: No  . Physically Abused: No  . Sexually Abused: No    Review of Systems: See HPI, otherwise negative ROS  Physical Exam: BP (!) 205/107   Pulse (!) 59   Temp 97.6 F (36.4 C) (Temporal)   Resp 18   Ht 5' 7.72" (1.72 m)   Wt 89 kg   SpO2 99%   BMI 30.08 kg/m  General:   Alert,  pleasant and cooperative in NAD Head:  Normocephalic and atraumatic. Neck:  Supple; no masses or thyromegaly. Lungs:  Clear throughout to auscultation, normal respiratory effort.    Heart:  +S1, +S2, Regular rate and rhythm, No edema. Abdomen:  Soft, nontender and nondistended. Normal bowel sounds, without guarding, and without rebound.   Neurologic:  Alert and  oriented x4;  grossly normal neurologically.  Impression/Plan: [facility] is here for an colonoscopy to be performed for Screening colonoscopy average risk   Risks, benefits, limitations, and alternatives regarding  colonoscopy have been reviewed with the patient.  Questions have been answered.  All parties agreeable.   Marcus Bellows, MD  10/07/2020, 10:41 AM

## 2020-10-07 NOTE — Anesthesia Postprocedure Evaluation (Signed)
Anesthesia Post Note  Patient: [employer]  Procedure(s) Performed: COLONOSCOPY WITH PROPOFOL (N/A )  Patient location during evaluation: Endoscopy Anesthesia Type: General Level of consciousness: awake and alert Pain management: pain level controlled Vital Signs Assessment: post-procedure vital signs reviewed and stable Respiratory status: spontaneous breathing, nonlabored ventilation, respiratory function stable and patient connected to nasal cannula oxygen Cardiovascular status: blood pressure returned to baseline and stable Postop Assessment: no apparent nausea or vomiting Anesthetic complications: no   No complications documented.   Last Vitals:  Vitals:   10/07/20 1132 10/07/20 1142  BP: (!) 142/96 140/84  Pulse:    Resp:    Temp:    SpO2:      Last Pain:  Vitals:   10/07/20 1142  TempSrc:   PainSc: 0-No pain                 Martha Clan

## 2020-10-07 NOTE — Transfer of Care (Signed)
Immediate Anesthesia Transfer of Care Note  Patient: [facility]  Procedure(s) Performed: COLONOSCOPY WITH PROPOFOL (N/A )  Patient Location: PACU  Anesthesia Type:General  Level of Consciousness: sedated  Airway & Oxygen Therapy: Patient Spontanous Breathing and Patient connected to nasal cannula oxygen  Post-op Assessment: Report given to RN and Post -op Vital signs reviewed and stable  Post vital signs: Reviewed and stable  Last Vitals:  Vitals Value Taken Time  BP 118/81 10/07/20 1113  Temp 36.4 C 10/07/20 1112  Pulse 63 10/07/20 1113  Resp 15 10/07/20 1113  SpO2 96 % 10/07/20 1113  Vitals shown include unvalidated device data.  Last Pain:  Vitals:   10/07/20 1112  TempSrc: Temporal  PainSc: 0-No pain         Complications: No complications documented.

## 2020-10-08 ENCOUNTER — Encounter: Payer: Self-pay | Admitting: Gastroenterology

## 2020-10-08 LAB — SURGICAL PATHOLOGY

## 2020-10-09 ENCOUNTER — Encounter: Payer: Self-pay | Admitting: Gastroenterology

## 2020-10-11 NOTE — Progress Notes (Signed)
Name: Marcus Gray   MRN: [id number]    DOB: 19-Dec-1955   Date:10/13/2020       Progress Note  Subjective  Chief Complaint  Follow up   HPI   CAD: patient had acute MI, with chest pain and dizziness, went to hospital in [place] back in 2016. He was admitted and had two stents placed. He moved to [facility] about 3 years ago and ran out of medications. . He was waiting to find a doctor that spoke [place]. He used to go to doctor's visits with his daughter when he lived in [place], explained [facility] has interpreters to help him but glad he finally got back in the system. He denies chest pain, palpitation or SOB, he has however noticed some fatigue and shoulder heaviness at times.  He has seen cardiologist , Dr. Saunders Revel, since his last visit with me. He is now on metoprolol, supposed to also be on norvasc and lisinopril 40 mg but did not bring all medications with him. Seems confused about medications. He denies orthopnea, no recent chest pain, denies decrease in exercise tolerance no lower extremity edema   FINDINGS  Left Ventricle: Left ventricular ejection fraction, by estimation, is 40  to 45%. The left ventricle has mildly decreased function. The left  ventricle has no regional wall motion abnormalities. The left ventricular  internal cavity size was normal in  size. There is no left ventricular hypertrophy. Left ventricular diastolic  parameters are consistent with Grade I diastolic dysfunction (impaired  relaxation).  MDD: he was diagnosed with depression after his MI, he was given Citalopram and it worked well for him, he states he retired at age 65, and had a Architect job until about 6 months ago, he states he is not longer working, he states tries to stay busy at home. He states he stopped taking Celexa a couple of months ago because he felt better after taking medication for a few weeks. Phq 9 negative, he does not want to resume medication  Gout: he is  taking Allopurinol , currently having a gout attack, right shoulder, right elbow and right wrist, he states finally going down, only took Tylenol , did not take colchicine.   Tobacco use : he started smoking after his MI, down to about 3 cigarettes daily, explained importance of quitting  HTN: bp is slightly elevated today, only brought  Metoprolol 25 mg, he states still has medications at home, but did not recognize the medications on his list. He did not take any bp this morning, explained he needs to bring all his medications, I am afraid that if he takes all his medications bp may drop. He states bp at home 120's-130   Patient Active Problem List   Diagnosis Date Noted  . Colon cancer screening 09/15/2020  . Coronary artery disease involving native coronary artery of native heart without angina pectoris 06/17/2020  . Essential hypertension 06/17/2020  . Hyperlipidemia LDL goal <70 06/17/2020    Past Surgical History:  Procedure Laterality Date  . CARDIAC CATHETERIZATION    . COLONOSCOPY WITH PROPOFOL N/A 10/07/2020   Procedure: COLONOSCOPY WITH PROPOFOL;  Surgeon: [name], MD;  Location: [facility] ENDOSCOPY;  Service: Gastroenterology;  Laterality: N/A;  . CORONARY STENT PLACEMENT  2016   [place]; OM1 and RCA    Family History  Problem Relation Age of Onset  . Diabetes Mother   . Emphysema Father   . Cerebral aneurysm Daughter     Social History  Tobacco Use  . Smoking status: Current Every Day Smoker    Packs/day: 0.25    Types: Cigarettes    Start date: 04/20/2016  . Smokeless tobacco: Never Used  Substance Use Topics  . Alcohol use: Yes    Alcohol/week: 5.0 standard drinks    Types: 5 Glasses of wine per week    Comment: wine at night     Current Outpatient Medications:  .  allopurinol (ZYLOPRIM) 100 MG tablet, Take 1 tablet (100 mg total) by mouth 2 (two) times daily., Disp: 180 tablet, Rfl: 1 .  amLODipine (NORVASC) 5 MG tablet, Take 1 tablet (5 mg total)  by mouth daily., Disp: 90 tablet, Rfl: 1 .  aspirin EC 81 MG tablet, Take 1 tablet (81 mg total) by mouth daily., Disp: 30 tablet, Rfl: 0 .  atorvastatin (LIPITOR) 80 MG tablet, Take 1 tablet (80 mg total) by mouth daily., Disp: 90 tablet, Rfl: 1 .  colchicine 0.6 MG tablet, Take 2 tablets (1.2 mg total) by mouth daily as needed. And may repeat in 24 hours if no improvement, after that may stop medication and take prn, Disp: 30 tablet, Rfl: 0 .  famotidine (PEPCID) 40 MG tablet, Take 1 tablet (40 mg total) by mouth daily., Disp: 89 tablet, Rfl: 2 .  metoprolol succinate (TOPROL-XL) 25 MG 24 hr tablet, Take 1 tablet (25 mg total) by mouth daily., Disp: 90 tablet, Rfl: 1 .  lisinopril (ZESTRIL) 40 MG tablet, Take 1 tablet (40 mg total) by mouth daily., Disp: 90 tablet, Rfl: 2  No Known Allergies  I personally reviewed active problem list, medication list, allergies, family history, social history, health maintenance with the patient/caregiver today.   ROS  Constitutional: Negative for fever or weight change.  Respiratory: Negative for cough and shortness of breath.   Cardiovascular: Negative for chest pain or palpitations.  Gastrointestinal: Negative for abdominal pain, no bowel changes.  Musculoskeletal: Negative for gait problem or joint swelling.  Skin: Negative for rash.  Neurological: Negative for dizziness or headache.  No other specific complaints in a complete review of systems (except as listed in HPI above).   Objective  Vitals:   10/13/20 1156 10/13/20 1157  BP: (!) 148/92 (!) 142/90  Pulse: 68   Resp: 16   Temp: 98.2 F (36.8 C)   TempSrc: Oral   SpO2: 99%   Weight: 177 lb (80.3 kg)   Height: 5\' 8"  (1.727 m)     Body mass index is 26.91 kg/m.  Physical Exam  Constitutional: Patient appears well-developed and well-nourished.  No distress.  HEENT: head atraumatic, normocephalic, pupils equal and reactive to light, neck supple Cardiovascular: Normal rate, regular  rhythm and normal heart sounds.  No murmur heard. No BLE edema. Pulmonary/Chest: Effort normal and breath sounds normal. No respiratory distress. Abdominal: Soft.  There is no tenderness. Muscular skeletal: pain , swelling and erythema right elbow, wrist and shoulder.  Psychiatric: Patient has a normal mood and affect. behavior is normal. Judgment and thought content normal.  Recent Results (from the past 2160 hour(s))  ECHOCARDIOGRAM COMPLETE     Status: None   Collection Time: 08/27/20 10:41 AM  Result Value Ref Range   AR max vel 3.06 cm2   AV Peak grad 7.5 mmHg   Ao pk vel 1.37 m/s   S' Lateral 4.95 cm   Area-P 1/2 1.78 cm2   AV Area VTI 2.88 cm2   AV Mean grad 4.0 mmHg   Single Plane A4C EF  43.2 %   Single Plane A2C EF 46.8 %   Calc EF 47.1 %   AV Area mean vel 2.98 cm2  SARS CORONAVIRUS 2 (TAT 6-24 HRS) Nasopharyngeal Nasopharyngeal Swab     Status: None   Collection Time: 10/05/20 12:03 PM   Specimen: Nasopharyngeal Swab  Result Value Ref Range   SARS Coronavirus 2 NEGATIVE NEGATIVE    Comment: (NOTE) SARS-CoV-2 target nucleic acids are NOT DETECTED.  The SARS-CoV-2 RNA is generally detectable in upper and lower respiratory specimens during the acute phase of infection. Negative results do not preclude SARS-CoV-2 infection, do not rule out co-infections with other pathogens, and should not be used as the sole basis for treatment or other patient management decisions. Negative results must be combined with clinical observations, patient history, and epidemiological information. The expected result is Negative.  Fact Sheet for Patients: SugarRoll.be  Fact Sheet for Healthcare Providers: https://www.woods-mathews.com/  This test is not yet approved or cleared by the [place] FDA and  has been authorized for detection and/or diagnosis of SARS-CoV-2 by FDA under an Emergency Use Authorization (EUA). This EUA will remain  in  effect (meaning this test can be used) for the duration of the COVID-19 declaration under Se ction 564(b)(1) of the Act, 21 U.S.C. section 360bbb-3(b)(1), unless the authorization is terminated or revoked sooner.  Performed at [facility], [place] [address] [place], [place] [address]   Surgical pathology     Status: None   Collection Time: 10/07/20 11:07 AM  Result Value Ref Range   SURGICAL PATHOLOGY      SURGICAL PATHOLOGY CASE: [id number] PATIENT: [facility] [place] Surgical Pathology Report     Specimen Submitted: A. Colon polyp, descending; cold snare  Clinical History: Colon cancer screening Z12.11.  Colon polyp    DIAGNOSIS: A. COLON POLYP, DESCENDING; COLD SNARE: - TUBULAR ADENOMA. - NEGATIVE FOR HIGH-GRADE DYSPLASIA AND MALIGNANCY.  GROSS DESCRIPTION: A. Labeled: Cold snare descending colon polyp Received: Formalin Collection time: 11:07 AM on 10/07/2020 Placed into formalin time: 11:07 AM on 10/07/2020 Tissue fragment(s): Multiple Size: Aggregate, 1 x 0.7 x 0.2 cm Description: Received is a single elongated and thin fragment of tan-pink soft tissue admixed with intestinal debris.  The ratio of soft tissue to intestinal debris is 50: 50. Entirely submitted in 1 cassette.  RB 10/07/2020  Final Diagnosis performed by Allena Napoleon, MD.   Electronically signed 10/08/2020 10:19:40AM The electronic signature indicates that the named Attending Pathologist ha s evaluated the specimen Technical component performed at [facility], [address], [place], [place] [address] Lab: [phone] Dir: Rush Farmer, MD, MMM  Professional component performed at [facility], [facility], [place], [place], [place] [address] Lab: [phone] Dir: Dellia Nims. Rubinas, MD      PHQ2/9: Depression screen [facility] 2/9 10/13/2020 07/20/2020 04/20/2020  Decreased Interest 0 3 3  Down, Depressed, Hopeless 0 1 3  PHQ - 2 Score 0 4 6  Altered sleeping - 3 1  Tired,  decreased energy - 3 3  Change in appetite - 0 3  Feeling bad or failure about yourself  - 0 0  Trouble concentrating - 0 0  Moving slowly or fidgety/restless - 0 0  Suicidal thoughts - 0 0  PHQ-9 Score - 10 13  Difficult doing work/chores - Very difficult Very difficult    phq 9 is negative   Fall Risk: Fall Risk  10/13/2020 07/20/2020 04/20/2020  Falls in the past year? 0 0 0  Number  falls in past yr: 0 0 0  Injury with Fall? 0 0 0     Functional Status Survey: Is the patient deaf or have difficulty hearing?: No Does the patient have difficulty seeing, even when wearing glasses/contacts?: No Does the patient have difficulty concentrating, remembering, or making decisions?: No Does the patient have difficulty walking or climbing stairs?: No Does the patient have difficulty dressing or bathing?: No Does the patient have difficulty doing errands alone such as visiting a doctor's office or shopping?: No    Assessment & Plan  1. Hypertension, benign  Slightly elevated but did not take medications today   2. Major depression in remission [facility])  He is no longer taking medication   3. Coronary artery disease involving native heart without angina pectoris, unspecified vessel or lesion type   4. Gout of multiple sites, unspecified cause, unspecified chronicity  - Ambulatory referral to Rheumatology Advised to take colchicine when he gets home , but if no improvement we will send prednisone tomorrow.   5. Chronic pain of both shoulders  - Ambulatory referral to Rheumatology  6. Diastolic dysfunction

## 2020-10-13 ENCOUNTER — Encounter: Payer: Self-pay | Admitting: Family Medicine

## 2020-10-13 ENCOUNTER — Ambulatory Visit (INDEPENDENT_AMBULATORY_CARE_PROVIDER_SITE_OTHER): Payer: Medicare Other | Admitting: Family Medicine

## 2020-10-13 ENCOUNTER — Other Ambulatory Visit: Payer: Self-pay

## 2020-10-13 VITALS — BP 142/90 | HR 68 | Temp 98.2°F | Resp 16 | Ht 68.0 in | Wt 177.0 lb

## 2020-10-13 DIAGNOSIS — I10 Essential (primary) hypertension: Secondary | ICD-10-CM

## 2020-10-13 DIAGNOSIS — I2510 Atherosclerotic heart disease of native coronary artery without angina pectoris: Secondary | ICD-10-CM | POA: Diagnosis not present

## 2020-10-13 DIAGNOSIS — M109 Gout, unspecified: Secondary | ICD-10-CM | POA: Insufficient documentation

## 2020-10-13 DIAGNOSIS — F325 Major depressive disorder, single episode, in full remission: Secondary | ICD-10-CM | POA: Diagnosis not present

## 2020-10-13 DIAGNOSIS — M25511 Pain in right shoulder: Secondary | ICD-10-CM

## 2020-10-13 DIAGNOSIS — G8929 Other chronic pain: Secondary | ICD-10-CM

## 2020-10-13 DIAGNOSIS — I5189 Other ill-defined heart diseases: Secondary | ICD-10-CM | POA: Insufficient documentation

## 2020-10-13 DIAGNOSIS — M25512 Pain in left shoulder: Secondary | ICD-10-CM

## 2020-10-13 NOTE — Patient Instructions (Signed)
Tome colchicine 0.6 dois tablets e se nao melhorar tome mais um em 1 hora Me ligue se tiver com dor amanha e mandarie prednisona para a Engineer, mining

## 2020-10-20 ENCOUNTER — Ambulatory Visit: Payer: Medicare Other

## 2020-10-20 ENCOUNTER — Other Ambulatory Visit: Payer: Self-pay

## 2020-10-20 VITALS — BP 142/78 | HR 81

## 2020-10-20 DIAGNOSIS — I10 Essential (primary) hypertension: Secondary | ICD-10-CM

## 2020-10-20 NOTE — Progress Notes (Signed)
Patient here for blood pressure check at last visit it was elevated but did not take BP meds.  1st check today was 142/78 and recheck was 136 82. Will send to Dr. Ancil Boozer for review.  Reviewed medications with patient, he is taking metoprolol 50 mg daily instead of 25 and not taking norvasc 5 mg, we will send rx for 50 mg since bp is at goal, return as scheduled

## 2020-10-20 NOTE — Patient Instructions (Signed)
Stop amlodopine and metoprolol 25mg  and take the new rx metoprolol 50mg 

## 2020-10-22 ENCOUNTER — Encounter: Payer: Self-pay | Admitting: Family

## 2020-10-22 ENCOUNTER — Other Ambulatory Visit: Payer: Self-pay

## 2020-10-22 ENCOUNTER — Ambulatory Visit: Payer: Medicare Other | Admitting: Family

## 2020-10-22 VITALS — BP 148/112 | HR 84 | Ht 68.0 in | Wt 175.0 lb

## 2020-10-22 DIAGNOSIS — I255 Ischemic cardiomyopathy: Secondary | ICD-10-CM

## 2020-10-22 DIAGNOSIS — I10 Essential (primary) hypertension: Secondary | ICD-10-CM | POA: Diagnosis not present

## 2020-10-22 DIAGNOSIS — E785 Hyperlipidemia, unspecified: Secondary | ICD-10-CM | POA: Diagnosis not present

## 2020-10-22 DIAGNOSIS — I2510 Atherosclerotic heart disease of native coronary artery without angina pectoris: Secondary | ICD-10-CM

## 2020-10-22 MED ORDER — AMLODIPINE BESYLATE 5 MG PO TABS: 5.0000 mg | ORAL_TABLET | Freq: Every day | ORAL | 1 refills | Status: DC

## 2020-10-22 NOTE — Progress Notes (Signed)
Office Visit    Patient Name: Marcus Gray Date of Encounter: 10/22/2020  Primary Care Provider:  Steele Sizer, MD Primary Cardiologist:  Nelva Bush, MD Electrophysiologist:  None   Chief Complaint    Marcus Gray is a 65 y.o. male with a hx ofcoronary artery disease s/p DES to RCA and OM1 in 2015, ischemic cardiomyopathy (EF 45% 2015), hx of alcohol use, thrombocytopenia, HTN, HLD, arthritis presents today for follow-up of blood pressure.   Past Medical History    Past Medical History:  Diagnosis Date  . Abnormal liver function test   . Anginal pain ([place])   . Arthritis   . CAD in native artery   . Coronary artery disease 06/19/2014   (a) s/p DES to RCA (Promus 2.25x28) and DES to OM1 (2.5x20) 06/19/14 via right radial artery at [facility]  . Diabetes mellitus without complication ([place])   . Dyslipidemia   . History of alcohol use   . History of myocardial infarction   . Hypertension   . Ischemic cardiomyopathy 05/2014   (a) 06/18/14 echo EF 45%, mild concentric LVH, nhypokinesis, moderate MR, moderate TR  . MI (myocardial infarction) ([place])   . Thrombocytopenia ([place])    Past Surgical History:  Procedure Laterality Date  . CARDIAC CATHETERIZATION    . COLONOSCOPY WITH PROPOFOL N/A 10/07/2020   Procedure: COLONOSCOPY WITH PROPOFOL;  Surgeon: [name], MD;  Location: [facility] ENDOSCOPY;  Service: Gastroenterology;  Laterality: N/A;  . CORONARY STENT PLACEMENT  2016   [place]; OM1 and RCA    Allergies  No Known Allergies  History of Present Illness    Marcus Gray is a 65 y.o. male with a history of coronary artery disease s/p DES to RCA and OM1 in 2015, ischemic cardiomyopathy (EF 45% 2015), hx of alcohol use, thrombocytopenia, HTN, HLD, arthritis last seen 08/24/2020  He was admitted to [facility] 06/18/14 - 06/20/14.  Records reviewed.  He was referred to the ED at that time for abnormal liver test and concern for  Tylenol toxicity and empirically treated with N acetylcysteine.  CXR showed vascular congestion.  Echocardiogram 06/18/14 mild concentric LVH, LVEF 45%, inferior wall severe hypokinesis, basal posterior lateral wall hypokinesis, mild LA enlargement, moderate MR without stenosis, trileaflet aortic valve, moderate TR, PASP 30 mmHg, small pericardial efusion that was circumferential.  Due to wall motion abnormalities he was evaluated by cardiology.  He had a left heart cath 06/19/2014 via the right radial artery.  He underwent balloon angioplasty and DES to RCA (Promus 2.25x28) which was 100% occluded and OM1 (2.5x20).  Morning.   Evaluated cardiology and underwent cardiac catheterization 06/19/2020 with DES to RCA and OM1.  He was discharged on aspirin, atorvastatin, Plavix, Lasix, lisinopril, metoprolol tartrate.  LDL during admission was 76.  He reports a history of MI in 2016 with PCI to OM1 and mid RCA at that time and [place], [place]. His previous anginal equivalent was not chest pain but instead feeling hot all over.  He was seen in the [facility] June 2021 complaining of chest pain radiating to back. CTA chest and high-sensitivity troponin unrevealing leading to diagnosis of bronchitis.  He established cardiology care by Dr. Saunders Revel 06/16/2020. He noted no anginal symptoms and was feeling overall well since his hospitalization June 2021 due to elevated blood pressure since that visit his lisinopril has been gradually increased to 40 mg daily, Toprol increased to 25 mg daily, and atorvastatin increased to 80 mg daily  due to LDL not at goal.  Echo January 2022 for monitoring with stable LVEF 40-45%, hypokinesis of inferior wall, grade 1 diastolic dysfunction, RV normal size and function, mild MR.  He was seen in clinic 08/24/2020 and amlodipine 5 L daily was added to his regimen due to uncontrolled blood pressure.  He reports today for follow-up.  Visit assisted by telephone [place] interpreter.  Reports home  blood pressure routinely 140s 150s.  Tells me he did start the amlodipine after last visit.  Medication reconciliation is somewhat difficult and as such I contacted pharmacy.  Noted that he last picked up a 90-day supply of lisinopril November 17 which means he has been out of this medication for 1 month.  Reports no shortness of breath nor dyspnea on exertion. Reports no chest pain, pressure, or tightness. No edema, orthopnea, PND. Reports no palpitations.    EKGs/Labs/Other Studies Reviewed:   The following studies were reviewed today:   EKG: No EKG is  ordered today.  The ekg independently reviewed from 08/24/2020 demonstrated sinus rhythm 60 bpm with stable prior inferior infarct.  No acute changes.  Recent Labs: 01/09/2020: Hemoglobin 16.4; Platelets 141 07/07/2020: ALT 37; BUN 8; Creatinine, Ser 0.80; Potassium 4.3; Sodium 141  Recent Lipid Panel    Component Value Date/Time   CHOL 190 07/07/2020 1018   TRIG 219 (H) 07/07/2020 1018   HDL 47 07/07/2020 1018   CHOLHDL 4.0 07/07/2020 1018   CHOLHDL 4.6 04/20/2020 1630   LDLCALC 105 (H) 07/07/2020 1018   LDLCALC 108 (H) 04/20/2020 1630   Home Medications   Current Meds  Medication Sig  . allopurinol (ZYLOPRIM) 100 MG tablet Take 1 tablet (100 mg total) by mouth 2 (two) times daily.  Marland Kitchen aspirin EC 81 MG tablet Take 1 tablet (81 mg total) by mouth daily.  Marland Kitchen atorvastatin (LIPITOR) 80 MG tablet Take 1 tablet (80 mg total) by mouth daily.  . colchicine 0.6 MG tablet Take 2 tablets (1.2 mg total) by mouth daily as needed. And may repeat in 24 hours if no improvement, after that may stop medication and take prn  . famotidine (PEPCID) 40 MG tablet Take 1 tablet (40 mg total) by mouth daily.  . metoprolol succinate (TOPROL-XL) 25 MG 24 hr tablet Take 1 tablet (25 mg total) by mouth daily. (Patient taking differently: Take 25 mg by mouth daily. Take 2 pills to make 50mg )     Review of Systems   Review of Systems  Constitutional: Negative  for chills, fever and malaise/fatigue.  Cardiovascular: Negative for chest pain, dyspnea on exertion, leg swelling, near-syncope, orthopnea, palpitations and syncope.  Respiratory: Negative for cough, shortness of breath and wheezing.   Gastrointestinal: Negative for nausea and vomiting.  Neurological: Negative for dizziness, light-headedness and weakness.   All other systems reviewed and are otherwise negative except as noted above.  Physical Exam    VS:  BP (!) 148/112   Pulse 84   Ht 5\' 8"  (1.727 m)   Wt 175 lb (79.4 kg)   BMI 26.61 kg/m  , BMI Body mass index is 26.61 kg/m.  Wt Readings from Last 3 Encounters:  10/22/20 175 lb (79.4 kg)  10/13/20 177 lb (80.3 kg)  10/07/20 196 lb 3.4 oz (89 kg)    GEN: Well nourished, well developed, in no acute distress. HEENT: normal. Neck: Supple, no JVD, carotid bruits, or masses. Cardiac: RRR, no murmurs, rubs, or gallops. No clubbing, cyanosis, edema.  Radials/DP/PT 2+ and equal bilaterally.  Respiratory:  Respirations regular and unlabored, clear to auscultation bilaterally. GI: Soft, nontender, nondistended. MS: No deformity or atrophy. Skin: Warm and dry, no rash. Neuro:  Strength and sensation are intact. Psych: Normal affect.  Assessment & Plan    1. Coronary artery disease / Ischemic cardiomyopathy - s/p DES to RCA and OM1 in 2016.  Echo at that time with LVEF 45%.  Euvolemic, well compensated on exam.  08/19/2020 echo with LVEF 40 is 45%.  GDMT includes aspirin, statin, metoprolol, lisinopril.  No indication for ischemic evaluation this time.  No indication for loop diuretic at this time.  NYHA I. Medication management somewhat difficult as he is uncertain what he is taking in the consistent with picking up prescriptions.  Resume lisinopril 40 mg daily as he has been out for 1 month.  Recommend healthy diet and regular cardiovascular exercise.  2. HTN-BP remains elevated.  He has not been taking his lisinopril and was encouraged  to pick it up at the pharmacy.  As he has been out of his lisinopril for 1 month we will continue present dose lisinopril 40 mg daily, amlodipine 5 mg daily, Toprol 25 mg daily.  3. HLD- Lipid panel 07/07/2020 with LDL 105.  LDL goal less than 70.  His atorvastatin was increased to 80 mg daily 07/21/2019 by his primary care provider. Continue Atorvastatin 80mg  daily.   Disposition: Follow up  in 4 month(s) with Dr. Saunders Revel or APP  Signed, Loel Dubonnet, NP 10/22/2020, 9:59 AM [place]

## 2020-10-22 NOTE — Patient Instructions (Addendum)
Medication Instructions:  Your physician has recommended you make the following change in your medication:   Continue Amlodipine 5mg  daily *Continuar Amlodipina 5mg . Tome um comprimido por dia *  Pick up Lisinopril 40mg  at the pharmacy. Take 1 tablet daily.  *Pegue Lisinopril 40mg  na farmcia. Tome 1 comprimido por dia para melhorar a presso arterial *  *If you need a refill on your cardiac medications before your next appointment, please call your pharmacy*   Lab Work: None today   Testing/Procedures: None today  Follow-Up: At [facility], you and your health needs are our priority.  As part of our continuing mission to provide you with exceptional heart care, we have created designated Provider Care Teams.  These Care Teams include your primary Cardiologist (physician) and Advanced Practice Providers (APPs -  Physician Assistants and Nurse Practitioners) who all work together to provide you with the care you need, when you need it.  We recommend signing up for the patient portal called "MyChart".  Sign up information is provided on this After Visit Summary.  MyChart is used to connect with patients for Virtual Visits (Telemedicine).  Patients are able to view lab/test results, encounter notes, upcoming appointments, etc.  Non-urgent messages can be sent to your provider as well.   To learn more about what you can do with MyChart, go to [email or web address].    Your next appointment:   4 month(s)  The format for your next appointment:   In Person  Provider:   Nelva Bush, MD   Other Instructions  Hipertenso, adultos Hypertension, Adult A presso arterial alta (hipertenso) ocorre quando o sangue  bombeado com muita fora [employer]. As artrias so os vasos sanguneos que transportam o sangue do corao para todo o corpo. A hipertenso fora o corao a trabalhar mais para bombear o sangue e pode fazer as artrias ficarem estreitas ou rgidas. A  hipertenso no tratada ou no controlada pode causar ataque cardaco, insuficincia cardaca, acidente vascular cerebral (AVC), doena renal e outros problemas. [place] presso arterial consiste em um nmero maior acima de um nmero [employer]. O ideal  que sua presso arterial fique abaixo de 120/80. O primeiro nmero ("de cima")  denominado de presso sistlica. Ele  a medida da presso nas artrias quando o corao bate. O segundo nmero ("de baixo")  denominado de presso diastlica. Ele  a medida da presso nas artrias quando o corao relaxa. Quais so as causas? A causa exata desse quadro clnico  desconhecida. Existem algumas condies que so causadoras ou esto relacionadas com presso arterial alta. O que aumenta o risco? Alguns fatores de risco para hipertenso so controlveis. Os fatores a seguir podem tornar voc mais suscetvel a desenvolver esse quadro clnico:  Tabagismo.  Ter diabetes mellitus tipo 2, colesterol elevado ou ambas as coisas.  No fazer exerccios ou atividades fsicas suficientes.  Sobrepeso.  Ingerir [employer], acar, calorias ou sal (sdio) na dieta.  Ingesto de bebidas alcolicas em excesso. Alguns fatores de risco para Nurse, adult presso arterial podem ser difceis ou impossveis de [place]. Alguns desses fatores incluem:  Ter doena renal crnica.  Ter histrico familiar de presso arterial elevada.  A idade. O risco aumenta com a idade.  Raa. Voc pode apresentar um risco maior se for afro-americano.  Sexo. Os homens apresentam risco maior que as mulheres antes dos 45 anos de idade. Aps os 65 anos, as mulheres tem risco maior que os homens.  Ter apneia obstrutiva do sono.  Tobey Grim.  Quais so os sinais ou sintomas? A hipertenso arterial pode no causar sintomas. Uma presso arterial muito elevada (crise hipertensiva) pode causar:  Dor de cabea.  Ansiedade.  Falta de ar.  [place] nasal.  Enjoo e  vmito.  Alteraes da viso.  Dor no peito intensa.  Convulses. Como esse quadro clnico  diagnosticado? Esse quadro clnico  diagnosticado medindo-se a presso arterial com voc sentado, com o brao repousando sobre uma superfcie, as pernas descruzadas e os ps apoiados no cho. A manga do monitor de presso arterial ser colocada diretamente contra a pele da parte superior do seu brao no nvel do corao. Ela deve ser medida pelo menos duas vezes usando-se o mesmo brao. Certos quadros clnicos podem causar uma diferena na presso arterial entre o brao direito e o esquerdo. Certos fatores podem fazer as Arboriculturist arterial ficarem mais baixas ou mais altas que o normal por um curto perodo:  Quando sua presso arterial fica mais alta no consultrio mdico do que quando voc est em casa, chamamos isso de hipertenso de consultrio. A [address] com esse quadro clnico no precisa de medicamentos.  Quando a presso arterial fica mais alta em casa do que quando voc est no consultrio mdico, o nome disso  hipertenso mascarada. A maioria das pessoas com esse quadro clnico pode precisar de medicamentos para Chief Technology Officer a presso arterial. Caso voc receba uma leitura de presso arterial elevada durante uma das suas consultas ou tenha presso normal, mas com outros fatores de risco, poder ser solicitado que voc:  Retorne em outro dia para sua presso arterial ser verificada novamente.  Monitore sua presso arterial em casa por 1 semana ou [place]. Caso receba diagnstico de hipertenso, voc poder fazer outros exames de sangue ou de imagem para que seu mdico avalie seu risco geral de outras doenas. Como esse quadro clnico  tratado? Esse quadro clnico  tratado com alteraes do estilo de vida, como uma alimentao saudvel, mais exerccios e reduo do consumo de lcool. Seu mdico poder prescrever medicamentos se as mudanas no estilo de vida no forem  suficientes para controlar sua presso arterial e se:  Sua presso arterial sistlica estiver acima de 130.  Sua presso arterial diastlica estiver acima de 80. Sua meta pessoal em termos de presso arterial pode variar dependendo de seus quadros clnicos, de sua idade e de outros fatores. Siga essas instrues em casa: Alimentos e bebidas  Tenha uma dieta com elevado teor de fibras e potssio e pobre em sdio, acares adicionados e gordura. Um exemplo de plano de alimentao  chamado dieta DASH (Dietary Approaches to Stop Hypertension, ou [employer]agens Nutricionais para Parar a Hipertenso). Para se alimentar dessa maneira: ? Coma grande quantidade de frutas e verduras. Tente preencher metade do seu prato em cada refeio com frutas e verduras. ? Coma cereais integrais, como massas de trigo integral, arroz integral ou po integral. Preencha cerca de um quarto do seu prato com gros integrais. ? Coma ou beba produtos lcteos com baixo teor de [place], tais como leite e iogurte desnatados. ? Evite carnes gordurosas, processadas ou defumadas e carne de aves com pele. Preencha cerca de um quarto do seu prato com fontes magras de protena, como peixe, frango sem pele, feijes, ovos ou tofu. ? Evite alimentos processados e semiprontos. Eles tendem a conter elevado teor de sdio, acares adicionados e gordura.  Reduza seu consumo dirio de sdio. A maioria das pessoas com hipertenso deve ingerir menos de 1.500 mg de sdio diariamente.  No consuma lcool se: ? Seu mdico disser para voc no beber. ? Estiver Gordy Clement, puder estar grvida ou planejar engravidar.  Se voc consome lcool: ? Limite seu consumo a:  0-1 drinque para mulheres.  0-2 drinques para homens. ? Observe quanto lcool sua bebida contm. Nos EUA, um drinque equivale a uma lata de cerveja de 12 oz (355 ml), uma taa de vinho de 5 oz (148 ml) ou uma dose de bebida destilada de 1 oz (44 ml).   Estilo de vida  Colabore com  seu mdico para manter um peso corporal saudvel ou para perder peso. Pergunte qual seria o seu peso ideal.  Faa pelo menos 30 minutos de exerccios fsicos na [employer]. Essas atividades podem incluir caminhar, nadar ou andar de bicicleta.  Inclua exerccios para fortalecer seus msculos (exerccios de resistncia), como pilates ou musculao, como parte da sua rotina semanal de exerccios. Tente fazer esses tipos de exerccio por pelo menos 30 minutos pelo menos 3 dias por semana.  No use nenhum produto que contenha nicotina ou tabaco, como cigarros tradicionais, cigarros eletrnicos e fumo de Higher education careers adviser. Caso precise de ajuda para parar de fumar, fale com seu mdico.  Monitore sua presso arterial em casa de acordo com as orientaes do seu mdico.  Comparea a todas as consultas de acompanhamento de acordo com as orientaes do seu mdico. Isso  importante.   Medicamentos  Tome medicamentos vendidos com ou sem receita mdica somente de acordo com as indicaes do seu mdico. Siga as orientaes atentamente. Os medicamentos para a presso arterial devem ser tomados conforme prescrito.  No pule doses do medicamento para a presso arterial. Fazer isso aumenta seu risco de problemas e pode reduzir a eficcia do medicamento.  Pergunte ao seu mdico sobre os efeitos colaterais ou reaes a medicamentos s [employer]. Entre em contato com um mdico se voc:  Achar que est tendo uma reao aos medicamentos que est tomando.  Sentir dores de cabea insistentes (recorrentes).  Sentir tontura.  Ficar com os tornozelos inchados.  Tiver problemas de viso. Busque ajuda imediatamente se voc:  Desenvolver dor de cabea intensa ou confuso.  Sentir fraqueza incomum ou dormncia.  Sentir-se fraco.  Sentir dor intensa no peito ou abdome.  Vomitar repetidamente.  Tiver dificuldade para respirar. Resumo  A hipertenso ocorre quando o sangue  bombeado com  fora demasiada atravs de suas artrias. Quando no controlado, esse quadro clnico pode aumentar seu risco de complicaes srias.  Sua meta pessoal em termos de presso arterial pode variar dependendo de seus quadros clnicos, de sua idade e de outros fatores. No caso da [employer], uma presso arterial normal  abaixo de 120/80.  A hipertenso  tratada com mudanas do estilo de vida, medicamentos ou uma combinao das duas coisas. Mudanas de estilo de vida incluem perda de peso, manter uma dieta saudvel, com pouco sdio, mais exerccios e limitao do lcool. Estas informaes no se destinam a substituir as recomendaes de seu mdico. No deixe de discutir quaisquer dvidas com seu mdico. Document Revised: 04/12/2018 Document Reviewed: 04/12/2018 Elsevier Patient Education  2021 [employer].

## 2020-10-26 NOTE — Progress Notes (Signed)
* * *         [facility]**        ---    Lawernce Ion. Sindy Messing, MD [facility] Kieth Brightly. Donnetta Simpers, MD [facility] Barbera Setters Byrd Hesselbach, MD  [facility];    Darlyn Chamber, MD [facility] Roosvelt Maser, MD [facility] Shanon Brow, MD [facility]  Nile Dear. Audley Hose, MD [facility]    Kandis Mannan A. Karie Mainland, MD [facility] Johnell Comings. MacNaught, MD [facility] Mitchel Honour, MD Erma Lemay, MD [facility]    Colletta Maryland, MD Weston Anna, MD [facility] Kerby Moors, NP Maximino Greenland ,  NP        * * *     **Patient Name:** [name]   **Date:** 09/22/2016    --- ---     **DOB:** Dec 08, 1955     **Referring Provider:** Dorita Sciara, MD   **Appointment Provider:** Trecia Rogers, MD        * * *    09/22/2016  Progress Notes: Trecia Rogers, MD    --- ---    ---        Current Medications    ---    Taking     * clopidogrel 75 mg Tablet 1 tab(s) orally once a day    ---    * Crestor 20 mg tablet 1 tab(s) orally once a day (at bedtime)    ---    * lisinopril-hctz 12.5 mg-20 mg tablet 1 tab(s) orally once a day    ---    * carvedilol 6.25 mg tablet 1 tab(s) orally 2 times a day    ---    * Aspirin 81 mg 1 tablet once daily    ---    * citalopram 20 mg tablet 1 tab(s) orally once a day    ---    * cyclobenzaprine 10 mg tablet 1 tab(s) orally Q.D.    ---    * Vitamin D3 50,000 intl units capsule 1 cap(s) orally once a wk    ---    * Vitamin B12 1000 mcg tablet 1 tab(s) orally once a day    ---    * meclizine 25 mg tablet 1 tab(s) orally 3 times a day prn    ---    * Medication List reviewed and reconciled with the patient    ---      Past Medical History    ---      CAD --> 90% OMB1 s/p DES (Promus 2x5 x 20) and 100% pRCA s/p DES(Promus 2x5  x 32), diffuse disease otherwise    ---    CM with LVEF 35% by cath and 45% by echo 11/15    ---    Hyperlipidemia    ---    Depression    ---    Vertigo    ---    ETOH in past    ---    Prior Smoker    ---    Denies hx of HTN, DM, CVA, TIA, MI, asthma, or GI bleeds    ---    Thrombocytopenia (low 100s), seen by  hematologist [facility] 2/16    ---      Family History    ---      Father: deceased 24 yrs    ---    Mother: deceased 64 yrs    ---    Siblings: alive, diagnosed with Heart Disease    ---    All brothers (4) with heart  diease -    Oldest brother had CABG    Middle brother with PTCI    younger brother PPM.    ---      Social History    ---    Tobacco Status: former smoker Quit 7/17, How long has it been since you last  smoked? 6-12 months, Patient counseled on the dangers of tobacco use:  09/22/2016.    Advised to lose weight: Yes .    No Presence of Falls .    ETOH: Prior heavy drinker; now not drinking (1 year or so).    no Recreational drug use.   Married; 4 chidren; workes as Administrator, arts;  originally from [place]; To retire next month and plans to move to [place] in 3/18.    ---      Allergies    ---      atorvastatin: myalgias with 20 mg qd dose: Side Effects    ---        Reason for Appointment    ---      1\. F/u CAD, LV dysfunction s/p 2 vessel PTCI, EF 35-45%, HTN, dyslipidemia    ---    2\. Cigarette abuse    ---      History of Present Illness    ---     _HPI_ :    65 year old presents today for followup and IPAD interpreter services used to  communicate with him    Coronary artery disease s/p PTCI RCA and OMB (drug-eluting stents) 11/15:  Feels well and no chest, arm or jaw symptoms and fleeting chest symptoms  desribed at last visit resolved. Can 1 mile without difficulty.    LV systolic dysfunction and ischemic cardiomyopathy: Denies shortness of  breath with current level of activity and no orthopnea or PND. He is somewhat  careful with salt in his diet and denies palpitations, dizzy spells or syncope    Hyperlipidemia: Developed myalgias related to atorvastatin but tolerating  crestor without myalgias    Cigar/Cigarette abuse: Quit 7 months ago and remains smoke free.      Vital Signs    ---    HR 66, BP 110/86, Wt 174, Ht 68, BMI 26.45, Med Assist: AN.      Past Orders    ---     _Lab:Lipid  Profile (Order Date - 03/16/2016) (Collection Date - 03/20/2016)_    Status  Fasting     \-    Chol  148    140-200 - mg/dL    HDL  55    47-82 - mg/dL    Trig  956    2-130 - mg/dL    LDL  70    8-657 - mg/dL      Examination    ---     _Reveals_ :    General appearance Well appearing man in NAD . HEENT Normocephalic,  atraumatic, Sclera anicteric, oropharynx clear. Neck exam No JVD/bruits,  Trachea in midline. Chest Symetrical to expansion . Lungs clear to  auscultation . Cardiac exam Nondisplaced PMI, Regular heart sounds without S3,  S4, murmurs or rubs, No heaves or thrills . Abdomen Soft, nondistended,  nontender with normal bowel sounds, No bruits or pulsatile masses. Extremity  No edema, clubbing or cyanosis, pulses 2+. Neuro Grossly nonfocal motor exam.  Skin warm and dry without rashes. Psych Mood is appropriate; alert and  oriented times 3.          Data    ---  _EKG (reviewed personally)_ :    6/17: NSR @ 61, OIMI and IVCD, Delayed R wave progression, minor nonspecific  lateral ST changes, mildly prolonged QT/QTc - similar to prior .    _Echo_ :    8/17: LVEF 45%, severe hypokinesis of inferior wall and inferoseptaum a/w  trace to mild MR; C/w prior echo, LVEF similar and MR less notable.    _Ankle Brachial Index_ :    12/16: Normal (1.2 R and 1.3 L).    _Lexiscan Myoview_ :    5/17 [facility]: LVEF 31% with inferior akinesis; inferior scar and mild peri-infarct  ischemia .    _Cardiac catheterization_ :    11/15: EF 35%, mild MR, diffuse CAD, pLAD 30%, mLAD 20 - 30%, pRCA 100% s/p  PTCI (2.5x32 Promus) DES and OMB1 90% stenosis s/p PTCI (2.5x 20 Promus) DES.          Impression/Recommendations    ---       **1\. Native vessel CAD without anginal symptoms**    Stop clopidogrel Tablet, 75 mg, 1 tab(s), orally, once a day, 90 days, 90    Clinical Notes: 2 vessel coronary disease with occluded right coronary artery  and tight OMB stenosis in setting of ACS, s/p PTCI of both lesions with drug-  eluting stents  11/15. He has had no anginal symptoms since that point and is  doing quite well at this point and can walk a mile without difficulty. Stress  test 5/17 reviewed with him notable for moderate sized inferior scar and mild  amount of peri-infact ischemia and based on findings and absence of symptoms,  medical therapy would be optimal strategy. To call for any exertional symptoms  and 911 for persistent symptoms > 20 min. In terms of dual antiplatelet  therapy, he is greater than 2 years post PTCI and quit smoking 7 months ago  and at this point, it would be reasonable to discontinue Plavix and continue  with aspirin 81 mg daily.    ---        **2\. Essential Hypertension**    Notes: Patient Educated with: [email or web address].pdf ([email or web address].pdf).    Clinical Notes: Well controlled on current regimen.        **3\. Left ventricular failure**    Refill carvedilol tablet, 6.25 mg, 1 tab(s), orally, 2 times a day, 30 day(s),  60, Refills 11    Clinical Notes: NYHA II symptoms without exam evidence for CHF and repeat Echo  with LVEF 45%. He is on a reasonable medical regimen and we discussed  ingestive heart failure and importance of salt restriction and we discussed  small risk for potentially fatal arrhythmias and to report any new symptoms to  me or his new cardiologist once he moved to [place].        **4\. Hypercholesterolemia**    Refill Crestor tablet, 20 mg, 1 tab(s), orally, once a day (at bedtime), 90  days, 90 tablets, Refills 3    Notes: Patient Educated with: [email or web address].pdf ([email or web address].pdf).    Clinical Notes: Atorvastatin related myalgias for tolerating rosuvastatin 20  mg daily and to continue for preventive efforts.        **5\. S/p PTCI**    Clinical Notes: plan to discontinue Plavix as above.        **6\. h/o Cigarette abuse; currently in remission**    Clinical Notes: Congratulated on smoke cessation and benefits reviewed with  him again.        **7\. Others**    Refill  lisinopril-hctz tablet, 12.5 mg-20 mg, 1 tab(s), orally, once a day, 90  days, 90, Refills 3      Follow Up    ---    prn    Electronically signed by Darlyn Chamber MD on 09/22/2016 at 07:10 PM EST    Sign off status: Completed        * * *        [facility]    [address]    [place], [place] [address]    Tel: [phone]    Fax: [phone]              * * *          Patient: [name], [name] DOB: [birth year] Progress Note: Trecia Rogers, MD  09/22/2016    ---    Note generated by eClinicalWorks EMR/PM Software ([email or web address])

## 2020-10-26 NOTE — Progress Notes (Signed)
* * *        **  [name]**    --- ---    42 Y old Male, DOB: April 17, 1956    [address], [place], [place] [address]    Home: [phone]    Provider: Darlyn Chamber, MD        * * *    Telephone Encounter    ---    Answered by   Illene Bolus, Nuha Degner  Date: 03/16/2016         Time: 12:42 PM    Reason   LIPIDS    --- ---            Action Taken   [facility] , MD 03/16/2016 12:43:34 PM > Did not have  lipids drawn. Please have him get lipids (fasting) in next week or so. Please  mail slip to him if he does not have prior slip. Thanks Nogueira,Ana 03/16/2016  1:32:16 PM > faxed lab slip called pt spoke with wife                * * *              * * *        ---        Reason for Appointment    ---      1\. LIPIDS    ---      Current Medications    ---      None    ---      Past Medical History    ---      CAD --> 90% OMB1 s/p DES (Promus 2x5 x 20) and 100% pRCA s/p DES(Promus 2x5  x 32), diffuse disease otherwise    ---    CM with LVEF 35% by cath and 45% by echo 11/15    ---    Hyperlipidemia    ---    Depression    ---    Vertigo    ---    ETOH in past    ---    Prior Smoker    ---    Denies hx of HTN, DM, CVA, TIA, MI, asthma, or GI bleeds    ---    Thrombocytopenia (low 100s), seen by hematologist [facility] 2/16    ---      Allergies    ---      atorvastatin: myalgias with 20 mg qd dose: Side Effects    ---      Assessments    ---    1\. Hypercholesterolemia - E78.0 (Primary)    ---      Impression/Recommendations    ---       **1\. Hypercholesterolemia**    _LAB: Lipid Profile (Ordered for 03/16/2016)_    ---          * * *           PatientWELTON, BORD DOB: [birth year] Provider: Darlyn Chamber, MD  03/16/2016    ---    Note generated by eClinicalWorks EMR/PM Software ([email or web address])

## 2020-10-26 NOTE — Progress Notes (Signed)
* * *         [facility], [facility]**        ---    Lawernce Ion. Sindy Messing, MD [facility] Kieth Brightly. Donnetta Simpers, MD [facility] Barbera Setters Byrd Hesselbach, MD  [facility];    Darlyn Chamber, MD [facility] Roosvelt Maser, MD [facility] Shanon Brow, MD [facility]  Nile Dear. Audley Hose, MD [facility]    Kandis Mannan A. Karie Mainland, MD [facility] Johnell Comings. MacNaught, MD [facility] Mitchel Honour, MD Erma Bodiford, MD [facility]    Colletta Maryland, MD Weston Anna, MD [facility] Kerby Moors, NP Maximino Greenland ,  NP        * * *     **Patient Name:** [name]   **Date:** 01/14/2016    --- ---     **DOB:** 1955-08-21     **Referring Provider:** Dorita Sciara, MD   **Appointment Provider:** Trecia Rogers, MD        * * *    01/14/2016  Progress Notes: Trecia Rogers, MD    --- ---    ---        Current Medications    ---    Taking     * Aspirin 325 mg 1 tab Oral qd    ---    * clopidogrel 75 mg Tablet 1 tab(s) orally once a day    ---    * citalopram 20 mg tablet 1 tab(s) orally once a day    ---    * cyclobenzaprine 10 mg tablet 1 tab(s) orally Q.D.    ---    * lisinopril 5 mg Tablet 1 tab(s) orally once a day at bedtime    ---    * Vitamin D3 50,000 intl units capsule 1 cap(s) orally once a wk    ---    * isosorbide mononitrate 30 mg tablet, extended release 1 tab(s) orally once a day (in the morning)    ---    * furosemide (Lasix) 20 mg tablet 1 tab(s) orally mon-wed-fri    ---    * Vitamin B12 1000 mcg tablet 1 tab(s) orally once a day    ---    * meclizine 25 mg tablet 1 tab(s) orally 3 times a day prn    ---    Not-Taking    * atorvastatin (Lipitor) 20 mg tablet 1 tab(s) orally once a day (at bedtime)    ---    * Medication List reviewed and reconciled with the patient    ---      Past Medical History    ---      CAD --> 90% OMB1 s/p DES (Promus 2x5 x 20) and 100% pRCA s/p DES(Promus 2x5  x 32), diffuse disease otherwise    ---    CM with LVEF 35% by cath and 45% by echo 11/15    ---    Hyperlipidemia    ---    Depression    ---    Vertigo    ---    ETOH in past     ---    Prior Smoker    ---    Denies hx of HTN, DM, CVA, TIA, MI, asthma, or GI bleeds    ---    Thrombocytopenia (low 100s), seen by hematologist [facility] 2/16    ---      Family History    ---      Father: deceased 71 yrs    ---    Mother:  deceased 22 yrs    ---    Siblings: alive, diagnosed with Heart Disease    ---    All brothers (4) with heart diease -    Oldest brother had CABG    Middle brother with PTCI    younger brother PPM.    ---      Social History    ---    Smoking Status: current smoker, How often do you smoke cigarettes? every day,  Patient counseled on the dangers of tobacco use: 01/14/2016.    Advised to lose weight: Yes .    No Presence of Falls .    ETOH: Prior heavy drinker; now not drinking (1 year or so).    no Recreational drug use.   Married; 4 chidren; workes as Administrator, arts;  originally from [place].    ---      Allergies    ---      atorvastatin: myalgias with 20 mg qd dose: Side Effects    ---        Reason for Appointment    ---      1\. F/u CAD, LV dysfunction s/p 2 vessel PTCI, EF 35-45%, HTN, dyslipidemia    ---    2\. Cigarette abuse    ---      History of Present Illness    ---     _HPI_ :    65 year old man presents today for followup and is accompanied by interpreter    Coronary artery disease s/p PTCI RCA and OMB (drug-eluting stents) 11/15:  Feels well and denies exertional chest, arm or jaw symptoms. He can walk 1  mile without difficulty although not sure whether he can walk 2 miles. He does  c/o intermittent epigastric symptoms    LV systolic dysfunction and ischemic cardiomyopathy: Denies any shortness of  breath with current level of activity and no orthopnea or PND. Somewhat  careful with salt in his diet and no orthopnea or PND. Denis palpitations,  unprovoked dizzy spells or syncope    Hyperlipidemia: Developed myalgias related to atorvastatin with bilateral leg  pains that resolved completely off atorvastatin and is currently not on a  statin    Cigar/Cigarette  abuse: smoking 4-5 cig/day.      Vital Signs    ---    HR 61, BP 142/90, Wt 176, Ht 68, BMI 26.76, Repeat BP 142/78, Med Assist: pd.      Examination    ---     _Reveals_ :    General appearance Well appearing man in NAD, accompanied by interpreter.  HEENT Normocephalic, atraumatic, Sclera anicteric, oropharynx clear. Neck exam  No JVD/bruits, Trachea in midline. Chest Symetrical to expansion . Lungs clear  to auscultation . Cardiac exam Nondisplaced PMI, Regular heart sounds without  S3, S4, murmurs or rubs, No heaves or thrills, Normal S1,physiologically split  S2. Abdomen Soft, nondistended, nontender with normal bowel sounds, No bruits  or pulsatile masses. Extremity No edema, clubbing or cyanosis, pulses 2+.  Neuro Grossly nonfocal motor exam. Skin warm and dry without rashes. Psych  Mood is appropriate; alert and oriented times 3.          Data    ---     _EKG (reviewed personally)_ :    NSR @ 61, OIMI and IVCD, Delayed R wave progression, minor nonspecific lateral  ST changes, mildly prolonged QT/QTc - similar to prior .    _Echo_ :    2/16: LVEF 45%, severe hypokinesis of  inferior wall and inferoseptaum a/2 mild  to moderate MR, similar to 11/15.    _Ankle Brachial Index_ :    12/16: Normal (1.2 R and 1.3 L).    _Lexiscan Myoview_ :    5/17 [facility]: LVEF 31% with inferior akinesis; inferior scar and mild peri-infarct  ischemia .    _Cardiac catheterization_ :    11/15: EF 35%, mild MR, diffuse CAD, pLAD 30%, mLAD 20 - 30%, pRCA 100% s/p  PTCI (2.5x32 Promus) DES and OMB1 90% stenosis s/p PTCI (2.5x 20 Promus) DES.          Impression/Recommendations    ---       **1\. Native vessel CAD without anginal symptoms**    Stop Aspirin, 325 mg, 1 tab, Oral, qd    Start Aspirin , 81 mg, 1 tablet, once daily, 90 Days, Refills 4    Stop isosorbide mononitrate tablet, extended release, 30 mg, 1 tab(s), orally,  once a day (in the morning)    _IMAGING: **EKG_    Clinical Notes: 2 vessel coronary disease with occluded right  coronary artery  and OMB stenosis in setting of ACS, s/p PTCI of both lesions with drug-eluting  stents 11/15 without anginal symptoms. Stress test 5/17 reviewed with him  notable for moderate sized inferior scar and mild amount of peri-infact  ischemia. In light of absence of symptoms and mild peri-infarct ischemia,  would favor continued medical therapy and he is on board. In terms of medical  therapy, recommend reducing aspirin to 81 mg qd and rationale d/w him and  epigastric symptoms may be related to higher dose aspirin and smoking. No  clear role for Imdur at this time and recommend discontinuing as well. To call  for any exertional symptoms and 911 for persistent symptoms > 20 min .    ---        **2\. Left ventricular failure**    Stop furosemide (Lasix) tablet, 20 mg, 1 tab(s), orally, Mon-Wed-Fridays    _IMAGING: **Echocardiogram (Ordered for 01/14/2016)_    Clinical Notes: NYHA II symptoms without exam evidence for CHF with LVEF 45%  at last echo although at recent stress test, calculated LVEF 31%. I suspect  that echo EF is closer to accurate and small risk for potentially fatal  arrythmias d/w him and to call for any new symptoms. I do recommend repeating  echo to reassess LVEF and to continue with salt restriction. No CHF and at  this time, will discontinue lasix 20 mg 3/week and change to HCTZ.        **3\. Hypercholesterolemia**    Stop atorvastatin (Lipitor) tablet, 20 mg, 1 tab(s), orally, once a day (at  bedtime)    Start Crestor tablet, 20 mg, 1 tab(s), orally, once a day (at bedtime), 30  day(s), 30, Refills 11    _LAB: Liver function tests (Ordered for 03/15/2016)_    _LAB: Lipid Profile (Ordered for 03/15/2016)_    Clinical Notes: Had atorvastatin related myalgias with bilateral leg pains  that completely resolved off atorvastatin and feels quite well at this point  with normal ABI PVR. We discussed the rationale for statins and recommend  trial of Crestor 20 mg daily and if tolerated, plan  is to repeat lipids in 2  months time to assess adequacy of therapy.        **4\. Abnormal stress test with nuclear imaging**    _IMAGING: **Echocardiogram (Ordered for 01/14/2016)_    Clinical Notes: As above.        **  5\. Cigarette smoker, current**    Clinical Notes: Disastrous consequences of continued smoking discussed with  him and strongly emphasized absolute and complete smoke cessation.        **6\. S/p PTCI**    Clinical Notes: Rationale for dual antiplatelet therapy discussed with him and  to continue for now given ACS, 2 stents and continued smoking.        **7\. Hypertension**    Stop lisinopril Tablet, 5 mg, 1 tab(s), orally, once a day at bedtime    Start lisinopril-hctz tablet, 12.5 mg-10 mg, 1 tab(s), orally, once a day, 30  day(s), 30 Tablet, Refills 11    _LAB: BMP (Ordered for 01/28/2016)_    Clinical Notes: Has hypertension and blood pressure is mildly elevated today.  Isosorbide mononitrate and Lasix are being discontinued and I recommend  increasing lisinopril to 10 mg a day and adding hydrochlorothiazide 12.5 mg  daily to his regimen and combination pill prescription sent to his pharmacy  and to be taken once a day in the morning. Potential side effects relating to  both lisinopril and hydrochlorothiazide discussed with him and will arrange  for chem 6 in 2 weeks' time to assess potassium and renal function. Outpatient  blood pressure checks are recommended with plans for follow-up with our nurse  practitioner in 3-4 weeks time to assess tolerance of above medication changes  as well as adequacy of blood pressure control.      Procedure Codes    ---      93[address] IH    ---      Follow Up    ---    6 Months; 3-4 weeks with NP    Electronically signed by Darlyn Chamber MD on 01/14/2016 at 05:44 PM EDT    Sign off status: Completed        * * *        [facility]    [address]    [place], [place] [address]    Tel: [phone]    Fax: [phone]              * *  *          Patient: [name], [name] DOB: Sep 21, 1955 Progress Note: Trecia Rogers, MD  01/14/2016    ---    Note generated by eClinicalWorks EMR/PM Software ([email or web address])

## 2020-10-26 NOTE — Progress Notes (Signed)
* * *        **  [name]**    --- ---    45 Y old Male, DOB: 07-22-1956    [address], [place], [place] [address]    Home: [phone]    Provider: Darlyn Chamber, MD        * * *    Telephone Encounter    ---    Answered by   Illene Bolus, Francois Elk  Date: 12/23/2015         Time: 12:23 PM    Reason   STRESS TEST    --- ---            Action Taken   [facility] , MD 12/23/2015 12:25:45 PM > Please call:  Stress test with small abnormality and if he is feeling well, will discuss  with him at appointment next month Nogueira,Ana 12/24/2015 10:55:26 AM > called  pt LMOM                * * *                ---          * * *          Patient: [name], [name] DOB: 1955-10-26 Provider: Darlyn Chamber, MD  12/23/2015    ---    Note generated by eClinicalWorks EMR/PM Software ([email or web address])

## 2020-10-26 NOTE — Progress Notes (Signed)
* * *        **  [name]**    --- ---    80 Y old Male, DOB: Dec 25, 1955    [address], [place], [place] [address]    Home: [phone]    Provider: Darlyn Chamber, MD        * * *    Telephone Encounter    ---    Answered by   Illene Bolus, Catelynn Sparger  Date: 03/23/2016         Time: 08:47 PM    Reason   LIPIDS    --- ---            Action Taken   [facility] , MD 03/23/2016 8:48:07 PM > Please call:  lipids were acceptable and LDL (bad cholesterol) was 70 and should be 70 or  less. No change in meds recommended. Nogueira,Ana 03/24/2016 9:29:51 AM >  called pt LMOM relayed message                * * *                ---          * * *          PatientALFREDO, [name] DOB: 1955-12-22 Provider: Darlyn Chamber, MD  03/23/2016    ---    Note generated by eClinicalWorks EMR/PM Software ([email or web address])

## 2020-10-26 NOTE — Progress Notes (Signed)
* * *         [facility], [facility]**        ---    Lawernce Ion. Sindy Messing, MD [facility] Kieth Brightly. Donnetta Simpers, MD [facility] Barbera Setters Byrd Hesselbach, MD  [facility];    Darlyn Chamber, MD [facility] Roosvelt Maser, MD [facility] Shanon Brow, MD [facility]  Nile Dear. Audley Hose, MD [facility]    Kandis Mannan A. Karie Mainland, MD [facility] Johnell Comings. MacNaught, MD [facility] Mitchel Honour, MD Erma Pua, MD [facility]    Colletta Maryland, MD Weston Anna, MD [facility] Kerby Moors, NP Maximino Greenland ,  NP        * * *     **Patient Name:** [name]   **Date:** 03/16/2016    --- ---     **DOB:** [birth year]     **Referring Provider:** Dorita Sciara, MD   **Appointment Provider:** Trecia Rogers, MD        * * *    03/16/2016  Progress Notes: Trecia Rogers, MD    --- ---    ---        Current Medications    ---    Taking     * lisinopril-hctz 12.5 mg-20 mg tablet 1 tab(s) orally once a day    ---    * Aspirin 81 mg 1 tablet once daily    ---    * Crestor 20 mg tablet 1 tab(s) orally once a day (at bedtime)    ---    * clopidogrel 75 mg Tablet 1 tab(s) orally once a day    ---    * citalopram 20 mg tablet 1 tab(s) orally once a day    ---    * cyclobenzaprine 10 mg tablet 1 tab(s) orally Q.D.    ---    * Vitamin D3 50,000 intl units capsule 1 cap(s) orally once a wk    ---    * Vitamin B12 1000 mcg tablet 1 tab(s) orally once a day    ---    * meclizine 25 mg tablet 1 tab(s) orally 3 times a day prn    ---      Past Medical History    ---      CAD --> 90% OMB1 s/p DES (Promus 2x5 x 20) and 100% pRCA s/p DES(Promus 2x5  x 32), diffuse disease otherwise    ---    CM with LVEF 35% by cath and 45% by echo 11/15    ---    Hyperlipidemia    ---    Depression    ---    Vertigo    ---    ETOH in past    ---    Prior Smoker    ---    Denies hx of HTN, DM, CVA, TIA, MI, asthma, or GI bleeds    ---    Thrombocytopenia (low 100s), seen by hematologist [facility] 2/16    ---      Family History    ---      Father: deceased 7 yrs    ---    Mother: deceased 52 yrs    ---    Siblings:  alive, diagnosed with Heart Disease    ---    All brothers (4) with heart diease -    Oldest brother had CABG    Middle brother with PTCI    younger brother PPM.    ---      Social History    ---  Smoking Status: former smoker Quit 7/17, How long has it been since you last  smoked? < 1 month, Patient counseled on the dangers of tobacco use:  03/16/2016.    Advised to lose weight: Yes .    No Presence of Falls .    ETOH: Prior heavy drinker; now not drinking (1 year or so).    no Recreational drug use.   Married; 4 chidren; workes as Administrator, arts;  originally from [place].    ---      Allergies    ---      atorvastatin: myalgias with 20 mg qd dose: Side Effects    ---        Reason for Appointment    ---      1\. F/u CAD, LV dysfunction s/p 2 vessel PTCI, EF 35-45%, HTN, dyslipidemia    ---    2\. Cigarette abuse    ---      History of Present Illness    ---     _HPI_ :    65 year old man presents today for followup (Ana, our [facility] Negron Torres served as  interpreter)    Coronary artery disease s/p PTCI RCA and OMB (drug-eluting stents) 11/15:  Feels well and denies exertional chest, arm or jaw symptoms although describes  fleeting chest symptoms lasting 1-2 seconds occasionally. He can walk 1 mile  without difficulty although not sure whether he can walk 2 miles.    LV systolic dysfunction and ischemic cardiomyopathy: Denies shortness of  breath with current level of activity and denies orthopnea or PND. He is  somewhat careful with salt in his diet and denies palpitations, dizzy spells  or syncope    Hyperlipidemia: Developed myalgias related to atorvastatin with bilateral leg  pains that resolved completely off atorvastatin and was switched to crestor  that is is tolerating without myalgias    Cigar/Cigarette abuse: Quit 1 month ago but strong urge to smoke.      Vital Signs    ---    HR 88, BP 138/96, Wt 175, Ht 68, BMI 26.61, Repeat BP 138/84, Med Assist: sm.      Examination    ---     _Reveals_ :    General appearance  Well appearing man in NAD . HEENT Normocephalic,  atraumatic, Sclera anicteric, oropharynx clear. Neck exam No JVD/bruits,  Trachea in midline. Chest Symetrical to expansion . Lungs clear to  auscultation . Cardiac exam Nondisplaced PMI, Regular heart sounds without S3,  S4, murmurs or rubs, No heaves or thrills, Normal S1,physiologically split S2.  Abdomen Soft, nondistended, nontender with normal bowel sounds, No bruits or  pulsatile masses. Extremity No edema, clubbing or cyanosis, pulses 2+. Neuro  Grossly nonfocal motor exam. Skin warm and dry without rashes. Psych Mood is  appropriate; alert and oriented times 3.          Data    ---     _EKG (reviewed personally)_ :    NSR @ 61, OIMI and IVCD, Delayed R wave progression, minor nonspecific lateral  ST changes, mildly prolonged QT/QTc - similar to prior .    _Echo_ :    8/17: LVEF 45%, severe hypokinesis of inferior wall and inferoseptaum a/w  trace to mild MR; C/w prior echo, LVEF similar and MR less notable.    _Ankle Brachial Index_ :    12/16: Normal (1.2 R and 1.3 L).    _Lexiscan Myoview_ :    5/17 [facility]: LVEF 31% with inferior akinesis;  inferior scar and mild peri-infarct  ischemia .    _Cardiac catheterization_ :    11/15: EF 35%, mild MR, diffuse CAD, pLAD 30%, mLAD 20 - 30%, pRCA 100% s/p  PTCI (2.5x32 Promus) DES and OMB1 90% stenosis s/p PTCI (2.5x 20 Promus) DES.          Impression/Recommendations    ---       **1\. Native vessel CAD without anginal symptoms**    Refill clopidogrel Tablet, 75 mg, 1 tab(s), orally, once a day, 90 days, 90,  Refills 3    Clinical Notes: 2 vessel coronary disease with occluded right coronary artery  and OMB stenosis in setting of ACS, s/p PTCI of both lesions with drug-eluting  stents 11/15 and has had no anginal symptoms since then. Fleeting 1-2 seconds  of chest symptoms nonischemic based on history. Stress test 5/17 reviewed with  him notable for moderate sized inferior scar and mild amount of peri-infact  ischemia  and based on findings and absence of symptoms, medical therapy would  be optimal strategy. To call for any exertional symptoms and 911 for  persistent symptoms > 20 min .    ---        **2\. Left ventricular failure**    Start carvedilol tablet, 6.25 mg, 1 tab(s), orally, 2 times a day, 30 day(s),  60, Refills 11    Clinical Notes: NYHA II symptoms without exam evidence for CHF and repeat Echo  with LVEF 45% recently and results reviewed with him. He would benefit  immensely from a betablocker in light of stress test findings and LV  dysfunction and will start coreg 6.25 mg bid although will take 1/2 tab bid  for 2 days and then increase to 1 tablet bid. In terms of LV dysfunction, we  discussed CHF, importance of being careful with salt in diet as well as risk  for potentially fatal arrythmias. LVEF is much above MADIT II criteria and  will call for any new symptoms.        **3\. Hypercholesterolemia**    Refill Crestor tablet, 20 mg, 1 tab(s), orally, once a day (at bedtime), 90  days, 90 tablets, Refills 3    _LAB: Lipid Profile (Ordered for 03/16/2016)_    Notes: Patient Educated with: [email or web address].pdf ([email or web address].pdf).    Clinical Notes: Had atorvastatin related myalgias with bilateral leg pains  that completely resolved off atorvastatin and is tolerating rosuvastatin 20 mg  daily. Did not have lipids drawn (he thought he had them last month but none  at [facility] and did have labs otherwise). Will request lipids again.        **4\. Abnormal stress test with nuclear imaging**    Clinical Notes: As above.        **5\. S/p PTCI**    Clinical Notes: Rationale for dual antiplatelet therapy reviewed and to  continue for now.        **6\. Hypertension**    Refill lisinopril-hctz tablet, 12.5 mg-20 mg, 1 tab(s), orally, once a day, 90  days, 90, Refills 3    Clinical Notes: Well controlled on current regimen.        **7\. h/o Cigarette abuse; currently in remission**    Clinical Notes: Congratulated on smoke  cessation and benefits reviewed with  him extensively.      Follow Up    ---    6 Months    Electronically signed by Darlyn Chamber MD on 03/16/2016 at 12:42 PM EDT    Sign  off status: Completed        * * *        [facility]    [address]    [place], [place] [address]    Tel: [phone]    Fax: [phone]              * * *          Patient: [name], [name] DOB: 09-03-1955 Progress Note: Trecia Rogers, MD  03/16/2016    ---    Note generated by eClinicalWorks EMR/PM Software ([email or web address])

## 2020-10-26 NOTE — Progress Notes (Signed)
* * *        **  [name]**    --- ---    34 Y old Male, DOB: 1955-09-18    [address], [place], [place] [address]    Home: [phone]    Provider: Darlyn Chamber, MD        * * *    Telephone Encounter    ---    Answered by   Illene Bolus, Dolphus Linch  Date: 01/14/2016         Time: 05:44 PM    Reason   ECHO    --- ---            Action Taken   [facility] , MD 01/14/2016 5:44:33 PM > Please schedule  echo to reassess LVEF Man,Sokhom 01/17/2016 4:33:58 PM > call and spoke w/  p.t's wife, booked echo on 02/09/16 at 8:00am. Emailed portuguese interp.                * * *                ---          * * *          PatientJANIS, [name] DOB: [birth year] Provider: Darlyn Chamber, MD  01/14/2016    ---    Note generated by eClinicalWorks EMR/PM Software ([email or web address])

## 2020-10-26 NOTE — Progress Notes (Signed)
* * *        **  [name]**    --- ---    89 Y old Male, DOB: Nov 18, 1955    [address], [place], [place] [address]    Home: [phone]    Provider: Darlyn Chamber, MD        * * *    Telephone Encounter    ---    Answered by   Mary Sella  Date: 02/09/2016         Time: 11:01 AM    Reason   no show echo .    --- ---            Message                      r/s from 7/12                Action Taken   Carlson,Dyann 02/11/2016 3:52:23 PM > LMOM to return my call  Carlson,Dyann 02/23/2016 3:35:51 PM > r/s 03/13/16                * * *                ---          * * *          Patient: [name], [name] DOB: 02-Sep-1955 Provider: Darlyn Chamber, MD  02/09/2016    ---    Note generated by eClinicalWorks EMR/PM Software ([email or web address])

## 2020-11-15 ENCOUNTER — Telehealth: Payer: Self-pay | Admitting: Family Medicine

## 2020-11-15 NOTE — Telephone Encounter (Signed)
Copied from [place] [phone]. Topic: Medicare AWV >> Nov 15, 2020  2:03 PM Cher Nakai R wrote: Reason for CRM:  No answer unable to leave a message for patient to call back and schedule Medicare Annual Wellness Visit (AWV) in office.   If unable to come into the office for AWV,  please offer to do virtually or by telephone.  No hx of AWV eligible as of 06/30/2020 awvi  Please schedule at anytime with [place].      40 Minutes appointment   Any questions, please call me at [phone]

## 2020-11-17 DIAGNOSIS — M1A09X0 Idiopathic chronic gout, multiple sites, without tophus (tophi): Secondary | ICD-10-CM | POA: Insufficient documentation

## 2020-11-19 DIAGNOSIS — M0609 Rheumatoid arthritis without rheumatoid factor, multiple sites: Secondary | ICD-10-CM | POA: Insufficient documentation

## 2020-12-23 ENCOUNTER — Telehealth: Payer: Self-pay | Admitting: Family Medicine

## 2020-12-23 NOTE — Telephone Encounter (Signed)
Copied from [place] [phone]. Topic: Medicare AWV >> Dec 23, 2020  1:38 PM Cher Nakai R wrote: Reason for CRM:    No answer unable to leave a message for patient to call back and schedule Medicare Annual Wellness Visit (AWV) in office.   If unable to come into the office for AWV,  please offer to do virtually or by telephone.  No hx of AWV eligible for AWVI as of 06/30/2020   Please schedule at anytime with [place].      40 Minutes appointment   Any questions, please call me at [phone]

## 2021-01-12 NOTE — Progress Notes (Signed)
Name: Marcus Gray   MRN: [id number]    DOB: 1955-12-14   Date:01/14/2021       Progress Note  Subjective  Chief Complaint  Follow up   HPI  CAD: patient had acute MI, with chest pain and dizziness, went to hospital in [place] back in 2016. He was admitted and had two stents placed. He moved to [facility] about 3 years ago and ran out of medications. . He was waiting to find a doctor that spoke [place]. He used to go to doctor's visits with his daughter when he lived in [place], explained [facility] has interpreters to help him but glad he finally got back in the system. He denies chest pain, palpitation or SOB, he has however noticed some fatigue and shoulder heaviness at times.  He has seen cardiologist , Dr. Saunders Revel, since his last visit with me. He is now on metoprolol, supposed to also be on norvasc and lisinopril 40 mg but did not bring all medications with him. Seems confused about medications. He denies orthopnea, no recent chest pain, denies decrease in exercise tolerance no lower extremity edema He wakes up feeling some SOB when he first wakes up but only a few seconds and resolved.   FINDINGS   Left Ventricle: Left ventricular ejection fraction, by estimation, is 40  to 45%. The left ventricle has mildly decreased function. The left  ventricle has no regional wall motion abnormalities. The left ventricular  internal cavity size was normal in  size. There is no left ventricular hypertrophy. Left ventricular diastolic  parameters are consistent with Grade I diastolic dysfunction (impaired  relaxation).  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 He states he stopped taking Celexa a couple of months ago because he felt better after taking medication  for a few weeks. He has noticed lack of appetite and also lack of motivation, not riding his bike, not working in his yard , he is wiling to go back on Citalopram   Gout: he is taking Allopurinol ,  no recent episodes, and last uric acid was at goal   Tobacco use : he started smoking after his MI, down to about 3 -4 cigarettes daily, explained importance of quitting  HTN: bp is slightly elevated today, only brought Metoprolol 25 mg and since last visit also on Norvasc 5 mg but not taking BID as prescribed and not taking lisinopril as given by cardiologist. He is willing to switch to lotrel to increase compliance. Denies side effects, no chest pain or palpitation.   RA: stopped plaquenil but willing to resume, going to see Rheumatologist next week   Patient Active Problem List   Diagnosis Date Noted   Rheumatoid arthritis of multiple sites with negative rheumatoid factor ([place]) 11/19/2020   Idiopathic chronic gout of multiple sites without tophus 11/17/2020   Gout of multiple sites 10/13/2020   Major depression in remission ([place]) 70/17/7939   Diastolic dysfunction 03/00/9233   Colon cancer screening 09/15/2020   Coronary artery disease involving native coronary artery of native heart without angina pectoris 06/17/2020   Essential hypertension 06/17/2020   Hyperlipidemia LDL goal <70 06/17/2020    Past Surgical History:  Procedure Laterality Date   CARDIAC CATHETERIZATION  COLONOSCOPY WITH PROPOFOL N/A 10/07/2020   Procedure: COLONOSCOPY WITH PROPOFOL;  Surgeon: [name], MD;  Location: [facility] ENDOSCOPY;  Service: Gastroenterology;  Laterality: N/A;   CORONARY STENT PLACEMENT  2016   [place]; OM1 and RCA    Family History  Problem Relation Age of Onset   Diabetes Mother    Emphysema Father    Cerebral aneurysm Daughter     Social History   Tobacco Use   Smoking status: Every Day    Packs/day: 0.25    Pack years: 0.00    Types: Cigarettes    Start date: 04/20/2016    Smokeless tobacco: Never  Substance Use Topics   Alcohol use: Yes    Alcohol/week: 5.0 standard drinks    Types: 5 Glasses of wine per week    Comment: wine at night     Current Outpatient Medications:    allopurinol (ZYLOPRIM) 100 MG tablet, Take 1 tablet (100 mg total) by mouth 2 (two) times daily., Disp: 180 tablet, Rfl: 1   allopurinol (ZYLOPRIM) 300 MG tablet, Take 300 mg by mouth daily., Disp: , Rfl:    amLODipine (NORVASC) 5 MG tablet, Take 1 tablet (5 mg total) by mouth daily., Disp: 180 tablet, Rfl: 1   aspirin EC 81 MG tablet, Take 1 tablet (81 mg total) by mouth daily., Disp: 30 tablet, Rfl: 0   atorvastatin (LIPITOR) 80 MG tablet, Take 1 tablet (80 mg total) by mouth daily., Disp: 90 tablet, Rfl: 1   colchicine 0.6 MG tablet, Take 2 tablets (1.2 mg total) by mouth daily as needed. And may repeat in 24 hours if no improvement, after that may stop medication and take prn, Disp: 30 tablet, Rfl: 0   hydroxychloroquine (PLAQUENIL) 200 MG tablet, Take 200 mg by mouth 2 (two) times daily., Disp: , Rfl:    lisinopril (ZESTRIL) 40 MG tablet, Take by mouth., Disp: , Rfl:    metoprolol succinate (TOPROL-XL) 25 MG 24 hr tablet, Take 1 tablet (25 mg total) by mouth daily. (Patient taking differently: Take 25 mg by mouth daily. Take 2 pills to make 50mg ), Disp: 90 tablet, Rfl: 1   predniSONE (DELTASONE) 5 MG tablet, Take by mouth., Disp: , Rfl:    famotidine (PEPCID) 40 MG tablet, Take 1 tablet (40 mg total) by mouth daily., Disp: 89 tablet, Rfl: 2   lisinopril (ZESTRIL) 40 MG tablet, Take 1 tablet (40 mg total) by mouth daily., Disp: 90 tablet, Rfl: 2  No Known Allergies  I personally reviewed active problem list, medication list, allergies, family history, social history, health maintenance with the patient/caregiver today.   ROS  Constitutional: Negative for fever or weight change.  Respiratory: Negative for cough and shortness of breath.   Cardiovascular: Negative for chest pain or  palpitations.  Gastrointestinal: Negative for abdominal pain, no bowel changes.  Musculoskeletal: Negative for gait problem or joint swelling.  Skin: Negative for rash.  Neurological: Negative for dizziness or headache.  No other specific complaints in a complete review of systems (except as listed in HPI above).   Objective  Vitals:   01/14/21 1133  BP: 140/88  Pulse: 87  Resp: 16  Temp: 98.2 F (36.8 C)  TempSrc: Oral  SpO2: 97%  Weight: 175 lb (79.4 kg)  Height: 5\' 8"  (1.727 m)    Body mass index is 26.61 kg/m.  Physical Exam  Constitutional: Patient appears well-developed and well-nourished. No distress.  HEENT: head atraumatic, normocephalic, pupils equal and reactive to light, neck supple Cardiovascular:  Normal rate, regular rhythm and normal heart sounds.  No murmur heard. No BLE edema. Pulmonary/Chest: Effort normal and breath sounds normal. No respiratory distress. Abdominal: Soft.  There is no tenderness. Skin: excoriation on right inner ankle, it happened a couple of days ago while doing his yard  Psychiatric: Patient has a normal mood and affect. behavior is normal. Judgment and thought content normal.   PHQ2/9: Depression screen [facility] 2/9 01/14/2021 10/13/2020 07/20/2020 04/20/2020  Decreased Interest 0 0 3 3  Down, Depressed, Hopeless 0 0 1 3  PHQ - 2 Score 0 0 4 6  Altered sleeping 0 - 3 1  Tired, decreased energy 0 - 3 3  Change in appetite 0 - 0 3  Feeling bad or failure about yourself  0 - 0 0  Trouble concentrating 0 - 0 0  Moving slowly or fidgety/restless 0 - 0 0  Suicidal thoughts 0 - 0 0  PHQ-9 Score 0 - 10 13  Difficult doing work/chores - - Very difficult Very difficult    phq 9 is negative   Fall Risk: Fall Risk  01/14/2021 10/13/2020 07/20/2020 04/20/2020  Falls in the past year? 0 0 0 0  Number falls in past yr: 0 0 0 0  Injury with Fall? 0 0 0 0     Functional Status Survey: Is the patient deaf or have difficulty hearing?: No Does  the patient have difficulty seeing, even when wearing glasses/contacts?: No Does the patient have difficulty concentrating, remembering, or making decisions?: No Does the patient have difficulty walking or climbing stairs?: No Does the patient have difficulty dressing or bathing?: No Does the patient have difficulty doing errands alone such as visiting a doctor's office or shopping?: No    Assessment & Plan  1. Coronary artery disease involving native heart without angina pectoris, unspecified vessel or lesion type  - amLODipine-benazepril (LOTREL) 5-20 MG capsule; Take 1 capsule by mouth daily. In place of amlodipine 5 mg and lisinopril 40 mg  Dispense: 90 capsule; Refill: 1 - atorvastatin (LIPITOR) 80 MG tablet; Take 1 tablet (80 mg total) by mouth daily.  Dispense: 90 tablet; Refill: 1  2. Hypertension, benign  - amLODipine-benazepril (LOTREL) 5-20 MG capsule; Take 1 capsule by mouth daily. In place of amlodipine 5 mg and lisinopril 40 mg  Dispense: 90 capsule; Refill: 1 - metoprolol succinate (TOPROL-XL) 25 MG 24 hr tablet; Take 1 tablet (25 mg total) by mouth daily.  Dispense: 90 tablet; Refill: 1  3. History of MI (myocardial infarction)  - metoprolol succinate (TOPROL-XL) 25 MG 24 hr tablet; Take 1 tablet (25 mg total) by mouth daily.  Dispense: 90 tablet; Refill: 1 - atorvastatin (LIPITOR) 80 MG tablet; Take 1 tablet (80 mg total) by mouth daily.  Dispense: 90 tablet; Refill: 1  4. Personal history of gout   5. Moderate episode of recurrent major depressive disorder (HCC)  - citalopram (CELEXA) 20 MG tablet; Take 1 tablet (20 mg total) by mouth daily.  Dispense: 90 tablet; Refill: 1    6. Excoriation  - Tdap vaccine greater than or equal to 7yo IM Date 01/12/2021 on right inner ankle   7. Need for Tdap vaccination  - Tdap vaccine greater than or equal to 7yo IM

## 2021-01-14 ENCOUNTER — Ambulatory Visit (INDEPENDENT_AMBULATORY_CARE_PROVIDER_SITE_OTHER): Payer: Medicare Other | Admitting: Family Medicine

## 2021-01-14 ENCOUNTER — Other Ambulatory Visit: Payer: Self-pay

## 2021-01-14 ENCOUNTER — Encounter: Payer: Self-pay | Admitting: Family Medicine

## 2021-01-14 VITALS — BP 140/88 | HR 87 | Temp 98.2°F | Resp 16 | Ht 68.0 in | Wt 175.0 lb

## 2021-01-14 DIAGNOSIS — I252 Old myocardial infarction: Secondary | ICD-10-CM | POA: Diagnosis not present

## 2021-01-14 DIAGNOSIS — T148XXA Other injury of unspecified body region, initial encounter: Secondary | ICD-10-CM | POA: Diagnosis not present

## 2021-01-14 DIAGNOSIS — Z8739 Personal history of other diseases of the musculoskeletal system and connective tissue: Secondary | ICD-10-CM

## 2021-01-14 DIAGNOSIS — Z23 Encounter for immunization: Secondary | ICD-10-CM | POA: Diagnosis not present

## 2021-01-14 DIAGNOSIS — I10 Essential (primary) hypertension: Secondary | ICD-10-CM | POA: Diagnosis not present

## 2021-01-14 DIAGNOSIS — S90511A Abrasion, right ankle, initial encounter: Secondary | ICD-10-CM

## 2021-01-14 DIAGNOSIS — I2510 Atherosclerotic heart disease of native coronary artery without angina pectoris: Secondary | ICD-10-CM | POA: Diagnosis not present

## 2021-01-14 DIAGNOSIS — F331 Major depressive disorder, recurrent, moderate: Secondary | ICD-10-CM

## 2021-01-14 MED ORDER — CITALOPRAM HYDROBROMIDE 20 MG PO TABS: 20.0000 mg | ORAL_TABLET | Freq: Every day | ORAL | 1 refills | Status: DC

## 2021-01-14 MED ORDER — AMLODIPINE BESY-BENAZEPRIL HCL 5-20 MG PO CAPS: 1.0000 | ORAL_CAPSULE | Freq: Every day | ORAL | 1 refills | Status: DC

## 2021-01-14 MED ORDER — ATORVASTATIN CALCIUM 80 MG PO TABS: 80.0000 mg | ORAL_TABLET | Freq: Every day | ORAL | 1 refills | Status: DC

## 2021-01-14 MED ORDER — METOPROLOL SUCCINATE ER 25 MG PO TB24: 25.0000 mg | ORAL_TABLET | Freq: Every day | ORAL | 1 refills | Status: DC

## 2021-01-17 DIAGNOSIS — Z7960 Long term (current) use of unspecified immunomodulators and immunosuppressants: Secondary | ICD-10-CM | POA: Insufficient documentation

## 2021-01-17 DIAGNOSIS — Z79899 Other long term (current) drug therapy: Secondary | ICD-10-CM | POA: Insufficient documentation

## 2021-01-19 ENCOUNTER — Ambulatory Visit: Payer: Medicare Other | Admitting: Gastroenterology

## 2021-01-19 ENCOUNTER — Encounter: Payer: Self-pay | Admitting: Gastroenterology

## 2021-01-19 ENCOUNTER — Other Ambulatory Visit: Payer: Self-pay

## 2021-01-19 VITALS — BP 155/81 | HR 83 | Temp 97.9°F | Ht 68.0 in | Wt 176.0 lb

## 2021-01-19 DIAGNOSIS — K219 Gastro-esophageal reflux disease without esophagitis: Secondary | ICD-10-CM | POA: Diagnosis not present

## 2021-01-19 NOTE — Progress Notes (Signed)
Marcus Bellows MD, MRCP(U.K) [address]  [place]  [place], [place] [address]  Main: [phone]  Fax: [phone]   Primary Care Physician: Marcus Sizer, MD  Primary Gastroenterologist:  Dr. Jonathon Gray   Chief Complaint  Patient presents with   Gastroesophageal Reflux    HPI: Marcus Gray Marcus Gray is a 65 y.o. male   Summary of history :  Initially referred and seen on 09/15/2020 for GERD and colon cancer screening.  Has had acid reflux for over 4 years.  Usually heartburn at night which wakes him up at sleep about 2 times a week was not on any medication.  I performed an average risk colon cancer screening colonoscopy and found 1 tubular adenoma 5 mm descending colon which was completely resected.  At his initial visit was commenced on 40 mg of Pepcid at night before bedtime and discussed about lifestyle changes  Interval history   09/15/2020-01/19/2021   Today the patient is here to see me for a follow-up.  I discussed the results of the colonoscopy and informed him that his next colonoscopy should be in 7 years.  The entire visit was conducted via telephone interpreter as he speaks only Marcus Gray.  He states that since starting 40 mg of Pepcid at night he has had no acid reflux symptoms he is adhering to lifestyle changes as well.  Current Outpatient Medications  Medication Sig Dispense Refill   allopurinol (ZYLOPRIM) 300 MG tablet Take 300 mg by mouth daily.     amLODipine-benazepril (LOTREL) 5-20 MG capsule Take 1 capsule by mouth daily. In place of amlodipine 5 mg and lisinopril 40 mg 90 capsule 1   aspirin EC 81 MG tablet Take 1 tablet (81 mg total) by mouth daily. 30 tablet 0   atorvastatin (LIPITOR) 80 MG tablet Take 1 tablet (80 mg total) by mouth daily. 90 tablet 1   citalopram (CELEXA) 20 MG tablet Take 1 tablet (20 mg total) by mouth daily. 90 tablet 1   colchicine 0.6 MG tablet Take 2 tablets (1.2 mg total) by mouth daily as needed. And may repeat in 24  hours if no improvement, after that may stop medication and take prn 30 tablet 0   famotidine (PEPCID) 40 MG tablet Take 1 tablet (40 mg total) by mouth daily. 89 tablet 2   hydroxychloroquine (PLAQUENIL) 200 MG tablet Take 200 mg by mouth 2 (two) times daily.     metoprolol succinate (TOPROL-XL) 25 MG 24 hr tablet Take 1 tablet (25 mg total) by mouth daily. 90 tablet 1   No current facility-administered medications for this visit.    Allergies as of 01/19/2021   (No Known Allergies)    ROS:  General: Negative for anorexia, weight loss, fever, chills, fatigue, weakness. ENT: Negative for hoarseness, difficulty swallowing , nasal congestion. CV: Negative for chest pain, angina, palpitations, dyspnea on exertion, peripheral edema.  Respiratory: Negative for dyspnea at rest, dyspnea on exertion, cough, sputum, wheezing.  GI: See history of present illness. GU:  Negative for dysuria, hematuria, urinary incontinence, urinary frequency, nocturnal urination.  Endo: Negative for unusual weight change.    Physical Examination:   BP (!) 155/81   Pulse 83   Temp 97.9 F (36.6 C) (Oral)   Ht 5\' 8"  (1.727 m)   Wt 176 lb (79.8 kg)   BMI 26.76 kg/m   General: Well-nourished, well-developed in no acute distress.  Eyes: No icterus. Conjunctivae pink. Neuro: Alert and oriented x 3.  Grossly intact.  Skin: Warm and dry, no jaundice.   Psych: Alert and cooperative, normal mood and affect.   Imaging Studies: No results found.  Assessment and Plan:   Marcus Gray is a 65 y.o. y/o male here to follow-up for acid reflux.  His symptoms have been well controlled on 40 mg of famotidine at night.  I suggested that he decrease it to 20 mg a day OTC for 6 to 8 weeks and if he is doing well to completely stop the medications.  Obviously if the symptoms come back he needs to go back on famotidine.  If there is worsening of his symptoms he should give me a call and get in touch with our office.  I  have informed him that his next colonoscopy will only be due in 7 years time.  Continue lifestyle changes for acid reflux.    Dr Marcus Bellows  MD,MRCP [facility] Follow up in as needed

## 2021-01-24 ENCOUNTER — Telehealth: Payer: Self-pay | Admitting: Family Medicine

## 2021-01-24 NOTE — Telephone Encounter (Signed)
Copied from [place] [id number]. Topic: Medicare AWV >> Jan 24, 2021  1:18 PM Cher Nakai R wrote: Reason for CRM:  No answer unable to leave a message for patient to call back and schedule Medicare Annual Wellness Visit (AWV) in office.   If unable to come into the office for AWV,  please offer to do virtually or by telephone.  No hx of AWV eligible for AWVI as of 06/30/2020  Please schedule at anytime with [place].      40 Minutes appointment   Any questions, please call me at [phone]

## 2021-03-02 ENCOUNTER — Ambulatory Visit: Payer: Medicare Other | Admitting: Internal Medicine

## 2021-03-02 ENCOUNTER — Telehealth: Payer: Self-pay | Admitting: Family Medicine

## 2021-03-02 NOTE — Telephone Encounter (Signed)
Copied from [place] [id number]. Topic: Medicare AWV >> Mar 02, 2021  1:48 PM Cher Nakai R wrote: Reason for CRM:   Left message for patient to call back and schedule Medicare Annual Wellness Visit (AWV) in office.   If unable to come into the office for AWV,  please offer to do virtually or by telephone.  No hx of AWV eligible for AWVI as of  06/30/2020  Please schedule at anytime with [place].      40 Minutes appointment   Any questions, please call me at [phone]

## 2021-03-16 ENCOUNTER — Telehealth: Payer: Self-pay | Admitting: Family Medicine

## 2021-03-16 NOTE — Telephone Encounter (Signed)
Copied from [place] [id number]. Topic: Medicare AWV >> Mar 16, 2021  1:39 PM Cher Nakai R wrote: Reason for CRM:   No answer unable to leave a message for patient to call back and schedule Medicare Annual Wellness Visit (AWV) in office.   If unable to come into the office for AWV,  please offer to do virtually or by telephone.  No hx of AWV eligible for AWVI as of 06/30/2020   Please schedule at anytime with [place].      40 Minutes appointment   Any questions, please call me at [phone]

## 2021-04-14 NOTE — Progress Notes (Deleted)
Name: Marcus Gray   MRN: [id number]    DOB: 03/25/1956   Date:04/14/2021       Progress Note  Subjective  Chief Complaint  Follow Up  HPI  CAD: patient had acute MI, with chest pain and dizziness, went to hospital in [place] back in 2016. He was admitted and had two stents placed. He moved to [facility] about 3 years ago and ran out of medications. . He was waiting to find a doctor that spoke [place]. He used to go to doctor's visits with his daughter when he lived in [place], explained [place] has interpreters to help him but glad he finally got back in the system. He denies chest pain, palpitation or SOB, he has however noticed some fatigue and shoulder heaviness at times.  He has seen cardiologist , Dr. Saunders Revel, since his last visit with me. He is now on metoprolol, supposed to also be on norvasc and lisinopril 40 mg but did not bring all medications with him. Seems confused about medications. He denies orthopnea, no recent chest pain, denies decrease in exercise tolerance no lower extremity edema He wakes up feeling some SOB when he first wakes up but only a few seconds and resolved.   FINDINGS   Left Ventricle: Left ventricular ejection fraction, by estimation, is 40  to 45%. The left ventricle has mildly decreased function. The left  ventricle has no regional wall motion abnormalities. The left ventricular  internal cavity size was normal in  size. There is no left ventricular hypertrophy. Left ventricular diastolic  parameters are consistent with Grade I diastolic dysfunction (impaired  relaxation).  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 He states he stopped taking Celexa a couple of months ago because he felt better after taking medication  for a few weeks. He has noticed lack of appetite and also lack of motivation, not riding his bike, not working in his yard , he is wiling to go back on Citalopram   Gout: he is taking Allopurinol ,  no recent episodes, and last uric acid was at goal   Tobacco use : he started smoking after his MI, down to about 3 -4 cigarettes daily, explained importance of quitting  HTN: bp is slightly elevated today, only brought Metoprolol 25 mg and since last visit also on Norvasc 5 mg but not taking BID as prescribed and not taking lisinopril as given by cardiologist. He is willing to switch to lotrel to increase compliance. Denies side effects, no chest pain or palpitation.   RA: stopped plaquenil but willing to resume, going to see Rheumatologist next week   Patient Active Problem List   Diagnosis Date Noted   Long-term use of immunosuppressant medication 01/17/2021   Rheumatoid arthritis of multiple sites with negative rheumatoid factor ([place]) 11/19/2020   Idiopathic chronic gout of multiple sites without tophus 11/17/2020   Gout of multiple sites 10/13/2020   Major depression in remission ([place]) Q000111Q   Diastolic dysfunction Q000111Q   Coronary artery disease involving native coronary artery of native heart without angina pectoris 06/17/2020   Essential hypertension 06/17/2020   Hyperlipidemia LDL goal <70 06/17/2020    Past Surgical History:  Procedure Laterality Date   CARDIAC CATHETERIZATION  COLONOSCOPY WITH PROPOFOL N/A 10/07/2020   Procedure: COLONOSCOPY WITH PROPOFOL;  Surgeon: [name], MD;  Location: [facility] ENDOSCOPY;  Service: Gastroenterology;  Laterality: N/A;   CORONARY STENT PLACEMENT  2016   [place]; OM1 and RCA    Family History  Problem Relation Age of Onset   Diabetes Mother    Emphysema Father    Cerebral aneurysm Daughter     Social History   Tobacco Use   Smoking status: Every Day    Packs/day: 0.25    Types: Cigarettes    Start date: 04/20/2016    Smokeless tobacco: Never  Substance Use Topics   Alcohol use: Yes    Alcohol/week: 5.0 standard drinks    Types: 5 Glasses of wine per week    Comment: wine at night     Current Outpatient Medications:    allopurinol (ZYLOPRIM) 300 MG tablet, Take 300 mg by mouth daily., Disp: , Rfl:    amLODipine-benazepril (LOTREL) 5-20 MG capsule, Take 1 capsule by mouth daily. In place of amlodipine 5 mg and lisinopril 40 mg, Disp: 90 capsule, Rfl: 1   aspirin EC 81 MG tablet, Take 1 tablet (81 mg total) by mouth daily., Disp: 30 tablet, Rfl: 0   atorvastatin (LIPITOR) 80 MG tablet, Take 1 tablet (80 mg total) by mouth daily., Disp: 90 tablet, Rfl: 1   citalopram (CELEXA) 20 MG tablet, Take 1 tablet (20 mg total) by mouth daily., Disp: 90 tablet, Rfl: 1   colchicine 0.6 MG tablet, Take 2 tablets (1.2 mg total) by mouth daily as needed. And may repeat in 24 hours if no improvement, after that may stop medication and take prn, Disp: 30 tablet, Rfl: 0   famotidine (PEPCID) 40 MG tablet, Take 1 tablet (40 mg total) by mouth daily., Disp: 89 tablet, Rfl: 2   hydroxychloroquine (PLAQUENIL) 200 MG tablet, Take 200 mg by mouth 2 (two) times daily., Disp: , Rfl:    metoprolol succinate (TOPROL-XL) 25 MG 24 hr tablet, Take 1 tablet (25 mg total) by mouth daily., Disp: 90 tablet, Rfl: 1  No Known Allergies  I personally reviewed {Reviewed:[id number]} with the patient/caregiver today.   ROS  ***  Objective  There were no vitals filed for this visit.  There is no height or weight on file to calculate BMI.  Physical Exam ***  No results found for this or any previous visit (from the past 2160 hour(s)).  Diabetic Foot Exam: Diabetic Foot Exam - Simple   No data filed    ***  PHQ2/9: Depression screen [facility] 2/9 01/14/2021 10/13/2020 07/20/2020 04/20/2020  Decreased Interest 0 0 3 3  Down, Depressed, Hopeless 0 0 1 3  PHQ - 2 Score 0 0 4 6  Altered sleeping 0 - 3 1  Tired, decreased energy 0 - 3 3   Change in appetite 0 - 0 3  Feeling bad or failure about yourself  0 - 0 0  Trouble concentrating 0 - 0 0  Moving slowly or fidgety/restless 0 - 0 0  Suicidal thoughts 0 - 0 0  PHQ-9 Score 0 - 10 13  Difficult doing work/chores - - Very difficult Very difficult    phq 9 is {gen pos [id number] ***  Fall Risk: Fall Risk  01/14/2021 10/13/2020 07/20/2020 04/20/2020  Falls in the past year? 0 0 0 0  Number falls in past yr: 0 0 0 0  Injury with Fall? 0 0 0 0   ***   Functional Status Survey:   ***  Assessment & Plan  *** There are no diagnoses linked to this encounter.

## 2021-04-15 ENCOUNTER — Ambulatory Visit: Payer: Medicare Other | Admitting: Family Medicine

## 2021-05-30 ENCOUNTER — Ambulatory Visit (INDEPENDENT_AMBULATORY_CARE_PROVIDER_SITE_OTHER): Payer: Medicare Other | Admitting: Family Medicine

## 2021-05-30 ENCOUNTER — Encounter: Payer: Self-pay | Admitting: Family Medicine

## 2021-05-30 ENCOUNTER — Other Ambulatory Visit: Payer: Self-pay

## 2021-05-30 VITALS — BP 130/78 | HR 74 | Temp 98.2°F | Resp 16 | Ht 68.0 in | Wt 172.8 lb

## 2021-05-30 DIAGNOSIS — I2510 Atherosclerotic heart disease of native coronary artery without angina pectoris: Secondary | ICD-10-CM

## 2021-05-30 DIAGNOSIS — M109 Gout, unspecified: Secondary | ICD-10-CM

## 2021-05-30 NOTE — Progress Notes (Signed)
Patient cancelled appointment, was not seen. No charge for encounter.

## 2021-06-08 NOTE — Progress Notes (Signed)
Name: Marcus Gray   MRN: [id number]    DOB: Aug 19, 1955   Date:06/09/2021       Progress Note  Subjective  Chief Complaint  Follow Up  HPI  CAD: patient had acute MI, with chest pain and dizziness, went to hospital in [place] back in 2016. He was admitted and had two stents placed. He moved to [facility] about 4 years ago and ran out of medications.. He denies chest pain, palpitation or SOB, he has however noticed some fatigue and shoulder heaviness at times.  He is now under the care of Dr. Saunders Revel. He is  on metoprolol, supposed to also be on Norvasc and Lisinopril 40 mg  He denies orthopnea, no recent chest pain, denies decrease in exercise tolerance or lower extremity edema  FINDINGS   Left Ventricle: Left ventricular ejection fraction, by estimation, is 40  to 45%. The left ventricle has mildly decreased function. The left  ventricle has no regional wall motion abnormalities. The left ventricular  internal cavity size was normal in  size. There is no left ventricular hypertrophy. Left ventricular diastolic  parameters are consistent with Grade I diastolic dysfunction (impaired  relaxation).  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  He told me today that he has diarrhea shortly after he takes Celexa, offered to change medication or take it at night, he prefers trying to take it at night. He still has some lack of appetite and motivation, but feeling better. Likes to be busy in his yard. Happy that his son is moving to [place] soon.   Gout: he is taking Allopurinol ,  he had uric acid checked 06/22 and it was elevated at 7.5   Smoker's cough: he started smoking after his MI, down to about 3 -4 cigarettes daily.He has a smoker's cough, usually wet in the mornings, no wheezing or sob. He is not interested in using inhalers, reminded him to try to quit  again   HTN: bp is at goal today, we will continue current medications.   RA: he is back on Plaquenil , denies joint pains or redness. Reviewed labs done at [facility] in June, normal sed rate at the time  Patient Active Problem List   Diagnosis Date Noted   Long-term use of immunosuppressant medication 01/17/2021   Rheumatoid arthritis of multiple sites with negative rheumatoid factor ([place]) 11/19/2020   Idiopathic chronic gout of multiple sites without tophus 11/17/2020   Gout of multiple sites 10/13/2020   Major depression in remission ([place]) 15/17/6160   Diastolic dysfunction 73/71/0626   Coronary artery disease involving native coronary artery of native heart without angina pectoris 06/17/2020   Essential hypertension 06/17/2020   Hyperlipidemia LDL goal <70 06/17/2020    Past Surgical History:  Procedure Laterality Date   CARDIAC CATHETERIZATION     COLONOSCOPY WITH PROPOFOL N/A 10/07/2020   Procedure: COLONOSCOPY WITH PROPOFOL;  Surgeon: [name], MD;  Location: [facility] ENDOSCOPY;  Service: Gastroenterology;  Laterality: N/A;   CORONARY STENT PLACEMENT  2016   [place]; OM1 and RCA    Family History  Problem Relation Age of Onset   Diabetes Mother    Emphysema Father    Cerebral aneurysm Daughter     Social History   Tobacco Use   Smoking status: Every Day    Packs/day: 0.25    Types: Cigarettes  Start date: 04/20/2016   Smokeless tobacco: Never  Substance Use Topics   Alcohol use: Yes    Alcohol/week: 5.0 standard drinks    Types: 5 Glasses of wine per week    Comment: wine at night     Current Outpatient Medications:    allopurinol (ZYLOPRIM) 300 MG tablet, Take 300 mg by mouth daily., Disp: , Rfl:    amLODipine-benazepril (LOTREL) 5-20 MG capsule, Take 1 capsule by mouth daily. In place of amlodipine 5 mg and lisinopril 40 mg, Disp: 90 capsule, Rfl: 1   aspirin EC 81 MG tablet, Take 1 tablet (81 mg total) by mouth daily., Disp: 30 tablet, Rfl:  0   atorvastatin (LIPITOR) 80 MG tablet, Take 1 tablet (80 mg total) by mouth daily., Disp: 90 tablet, Rfl: 1   citalopram (CELEXA) 20 MG tablet, Take 1 tablet (20 mg total) by mouth daily., Disp: 90 tablet, Rfl: 1   colchicine 0.6 MG tablet, Take 2 tablets (1.2 mg total) by mouth daily as needed. And may repeat in 24 hours if no improvement, after that may stop medication and take prn, Disp: 30 tablet, Rfl: 0   hydroxychloroquine (PLAQUENIL) 200 MG tablet, Take 200 mg by mouth 2 (two) times daily., Disp: , Rfl:    metoprolol succinate (TOPROL-XL) 25 MG 24 hr tablet, Take 1 tablet (25 mg total) by mouth daily., Disp: 90 tablet, Rfl: 1   famotidine (PEPCID) 40 MG tablet, Take 1 tablet (40 mg total) by mouth daily., Disp: 89 tablet, Rfl: 2  No Known Allergies  I personally reviewed active problem list, medication list, allergies, family history, social history, health maintenance with the patient/caregiver today.   ROS  Constitutional: Negative for fever or weight change.  Respiratory: Negative for cough and shortness of breath.   Cardiovascular: Negative for chest pain or palpitations.  Gastrointestinal: Negative for abdominal pain, no bowel changes.  Musculoskeletal: Negative for gait problem or joint swelling.  Skin: Negative for rash.  Neurological: Negative for dizziness or headache.  No other specific complaints in a complete review of systems (except as listed in HPI above).   Objective  Vitals:   06/09/21 1057  BP: 134/72  Pulse: 67  Resp: 16  Temp: 98.3 F (36.8 C)  SpO2: 96%  Weight: 176 lb (79.8 kg)  Height: 5\' 8"  (1.727 m)    Body mass index is 26.76 kg/m.  Physical Exam  Constitutional: Patient appears well-developed and well-nourished.  No distress.  HEENT: head atraumatic, normocephalic, pupils equal and reactive to light, neck supple Cardiovascular: Normal rate, regular rhythm and normal heart sounds.  No murmur heard. No BLE edema. Pulmonary/Chest: Effort  normal and breath sounds normal. No respiratory distress. Abdominal: Soft.  There is no tenderness. Psychiatric: Patient has a normal mood and affect. behavior is normal. Judgment and thought content normal.    PHQ2/9: Depression screen [facility] 2/9 06/09/2021 05/30/2021 01/14/2021 10/13/2020 07/20/2020  Decreased Interest 0 0 0 0 3  Down, Depressed, Hopeless 0 0 0 0 1  PHQ - 2 Score 0 0 0 0 4  Altered sleeping 0 0 0 - 3  Tired, decreased energy 0 0 0 - 3  Change in appetite 0 0 0 - 0  Feeling bad or failure about yourself  0 0 0 - 0  Trouble concentrating 0 0 0 - 0  Moving slowly or fidgety/restless 0 0 0 - 0  Suicidal thoughts 0 0 0 - 0  PHQ-9 Score 0 0 0 - 10  Difficult doing work/chores - Not difficult at all - - Very difficult    phq 9 is negative   Fall Risk: Fall Risk  06/09/2021 05/30/2021 01/14/2021 10/13/2020 07/20/2020  Falls in the past year? 0 0 0 0 0  Number falls in past yr: 0 0 0 0 0  Injury with Fall? 0 0 0 0 0  Risk for fall due to : No Fall Risks - - - -  Follow up Falls prevention discussed - - - -      Functional Status Survey: Is the patient deaf or have difficulty hearing?: No Does the patient have difficulty seeing, even when wearing glasses/contacts?: No Does the patient have difficulty concentrating, remembering, or making decisions?: No Does the patient have difficulty walking or climbing stairs?: No Does the patient have difficulty dressing or bathing?: No Does the patient have difficulty doing errands alone such as visiting a doctor's office or shopping?: No    Assessment & Plan  1. Mild episode of recurrent major depressive disorder (HCC)  - citalopram (CELEXA) 20 MG tablet; Take 1 tablet (20 mg total) by mouth every evening.  Dispense: 90 tablet; Refill: 1  2. History of MI (myocardial infarction)  - atorvastatin (LIPITOR) 80 MG tablet; Take 1 tablet (80 mg total) by mouth daily.  Dispense: 90 tablet; Refill: 1 - metoprolol succinate  (TOPROL-XL) 25 MG 24 hr tablet; Take 1 tablet (25 mg total) by mouth daily.  Dispense: 90 tablet; Refill: 1  3. Hypertension, benign  - COMPLETE METABOLIC PANEL WITH GFR - amLODipine-benazepril (LOTREL) 5-20 MG capsule; Take 1 capsule by mouth daily. In place of amlodipine 5 mg and lisinopril 40 mg  Dispense: 90 capsule; Refill: 1 - metoprolol succinate (TOPROL-XL) 25 MG 24 hr tablet; Take 1 tablet (25 mg total) by mouth daily.  Dispense: 90 tablet; Refill: 1  4. Need for immunization against influenza  - Flu Vaccine QUAD High Dose(Fluad)  5. Controlled gout   6. Rheumatoid arthritis of multiple sites with negative rheumatoid factor ([place])   7. Hyperglycemia  - Hemoglobin A1c  8. Coronary artery disease involving native heart without angina pectoris, unspecified vessel or lesion type  - Lipid panel - amLODipine-benazepril (LOTREL) 5-20 MG capsule; Take 1 capsule by mouth daily. In place of amlodipine 5 mg and lisinopril 40 mg  Dispense: 90 capsule; Refill: 1 - atorvastatin (LIPITOR) 80 MG tablet; Take 1 tablet (80 mg total) by mouth daily.  Dispense: 90 tablet; Refill: 1  9. Encounter for long-term current use of high risk medication  - CBC with Differential/Platelet  10. Need for shingles vaccine  - Zoster Vaccine Adjuvanted [facility]) injection; Inject 0.5 mLs into the muscle once for 1 dose.  Dispense: 0.5 mL; Refill: 1   11. Smokers' cough [facility])  Discussed importance of quitting smoking

## 2021-06-09 ENCOUNTER — Other Ambulatory Visit: Payer: Self-pay

## 2021-06-09 ENCOUNTER — Encounter: Payer: Self-pay | Admitting: Family Medicine

## 2021-06-09 ENCOUNTER — Ambulatory Visit (INDEPENDENT_AMBULATORY_CARE_PROVIDER_SITE_OTHER): Payer: Medicare Other | Admitting: Family Medicine

## 2021-06-09 VITALS — BP 134/72 | HR 67 | Temp 98.3°F | Resp 16 | Ht 68.0 in | Wt 176.0 lb

## 2021-06-09 DIAGNOSIS — M109 Gout, unspecified: Secondary | ICD-10-CM

## 2021-06-09 DIAGNOSIS — J410 Simple chronic bronchitis: Secondary | ICD-10-CM

## 2021-06-09 DIAGNOSIS — I10 Essential (primary) hypertension: Secondary | ICD-10-CM

## 2021-06-09 DIAGNOSIS — R739 Hyperglycemia, unspecified: Secondary | ICD-10-CM

## 2021-06-09 DIAGNOSIS — I252 Old myocardial infarction: Secondary | ICD-10-CM | POA: Diagnosis not present

## 2021-06-09 DIAGNOSIS — M0609 Rheumatoid arthritis without rheumatoid factor, multiple sites: Secondary | ICD-10-CM

## 2021-06-09 DIAGNOSIS — Z79899 Other long term (current) drug therapy: Secondary | ICD-10-CM

## 2021-06-09 DIAGNOSIS — F330 Major depressive disorder, recurrent, mild: Secondary | ICD-10-CM | POA: Diagnosis not present

## 2021-06-09 DIAGNOSIS — Z23 Encounter for immunization: Secondary | ICD-10-CM | POA: Diagnosis not present

## 2021-06-09 DIAGNOSIS — R718 Other abnormality of red blood cells: Secondary | ICD-10-CM | POA: Diagnosis not present

## 2021-06-09 DIAGNOSIS — I2510 Atherosclerotic heart disease of native coronary artery without angina pectoris: Secondary | ICD-10-CM

## 2021-06-09 MED ORDER — ATORVASTATIN CALCIUM 80 MG PO TABS
80.0000 mg | ORAL_TABLET | Freq: Every day | ORAL | 1 refills | Status: DC
Start: 2021-06-09 — End: 2021-12-08

## 2021-06-09 MED ORDER — CITALOPRAM HYDROBROMIDE 20 MG PO TABS: 20.0000 mg | ORAL_TABLET | Freq: Every evening | ORAL | 1 refills | Status: DC

## 2021-06-09 MED ORDER — METOPROLOL SUCCINATE ER 25 MG PO TB24: 25.0000 mg | ORAL_TABLET | Freq: Every day | ORAL | 1 refills | Status: DC

## 2021-06-09 MED ORDER — AMLODIPINE BESY-BENAZEPRIL HCL 5-20 MG PO CAPS: 1.0000 | ORAL_CAPSULE | Freq: Every day | ORAL | 1 refills | Status: DC

## 2021-06-09 MED ORDER — SHINGRIX 50 MCG/0.5ML IM SUSR: 0.5000 mL | Freq: Once | INTRAMUSCULAR | 1 refills | Status: AC

## 2021-06-10 ENCOUNTER — Other Ambulatory Visit: Payer: Self-pay

## 2021-06-10 DIAGNOSIS — R718 Other abnormality of red blood cells: Secondary | ICD-10-CM

## 2021-06-15 ENCOUNTER — Other Ambulatory Visit: Payer: Self-pay

## 2021-06-15 DIAGNOSIS — R5383 Other fatigue: Secondary | ICD-10-CM

## 2021-06-15 DIAGNOSIS — R718 Other abnormality of red blood cells: Secondary | ICD-10-CM

## 2021-06-15 DIAGNOSIS — Z79899 Other long term (current) drug therapy: Secondary | ICD-10-CM

## 2021-06-16 LAB — CBC WITH DIFFERENTIAL/PLATELET
Absolute Monocytes: 893 cells/uL (ref 200–950)
Basophils Absolute: 50 cells/uL (ref 0–200)
Basophils Relative: 0.7 %
Eosinophils Absolute: 79 cells/uL (ref 15–500)
Eosinophils Relative: 1.1 %
HCT: 47.4 % (ref 38.5–50.0)
Hemoglobin: 16.9 g/dL (ref 13.2–17.1)
Lymphs Abs: 2779 cells/uL (ref 850–3900)
MCH: 35.9 pg — ABNORMAL HIGH (ref 27.0–33.0)
MCHC: 35.7 g/dL (ref 32.0–36.0)
MCV: 100.6 fL — ABNORMAL HIGH (ref 80.0–100.0)
MPV: 13 fL — ABNORMAL HIGH (ref 7.5–12.5)
Monocytes Relative: 12.4 %
Neutro Abs: 3398 cells/uL (ref 1500–7800)
Neutrophils Relative %: 47.2 %
Platelets: 139 10*3/uL — ABNORMAL LOW (ref 140–400)
RBC: 4.71 10*6/uL (ref 4.20–5.80)
RDW: 12.8 % (ref 11.0–15.0)
Total Lymphocyte: 38.6 %
WBC: 7.2 10*3/uL (ref 3.8–10.8)

## 2021-06-16 LAB — COMPLETE METABOLIC PANEL WITH GFR
AG Ratio: 1.7 (calc) (ref 1.0–2.5)
ALT: 40 U/L (ref 9–46)
AST: 28 U/L (ref 10–35)
Albumin: 4.5 g/dL (ref 3.6–5.1)
Alkaline phosphatase (APISO): 75 U/L (ref 35–144)
BUN/Creatinine Ratio: 8 (calc) (ref 6–22)
BUN: 6 mg/dL — ABNORMAL LOW (ref 7–25)
CO2: 23 mmol/L (ref 20–32)
Calcium: 9.4 mg/dL (ref 8.6–10.3)
Chloride: 105 mmol/L (ref 98–110)
Creat: 0.73 mg/dL (ref 0.70–1.35)
Globulin: 2.6 g/dL (calc) (ref 1.9–3.7)
Glucose, Bld: 88 mg/dL (ref 65–99)
Potassium: 4.1 mmol/L (ref 3.5–5.3)
Sodium: 137 mmol/L (ref 135–146)
Total Bilirubin: 0.6 mg/dL (ref 0.2–1.2)
Total Protein: 7.1 g/dL (ref 6.1–8.1)
eGFR: 100 mL/min/{1.73_m2} (ref >=60)

## 2021-06-16 LAB — HEMOGLOBIN A1C
Hgb A1c MFr Bld: 6 % of total Hgb — ABNORMAL HIGH (ref <5.7)
Mean Plasma Glucose: 126 mg/dL
eAG (mmol/L): 7 mmol/L

## 2021-06-16 LAB — LIPID PANEL
Cholesterol: 174 mg/dL (ref <200)
HDL: 43 mg/dL (ref >=40)
LDL Cholesterol (Calc): 98 mg/dL (calc)
Non-HDL Cholesterol (Calc): 131 mg/dL (calc) — ABNORMAL HIGH (ref <130)
Total CHOL/HDL Ratio: 4 (calc) (ref <5.0)
Triglycerides: 211 mg/dL — ABNORMAL HIGH (ref <150)

## 2021-06-16 LAB — B12 AND FOLATE PANEL
Folate: 3 ng/mL — ABNORMAL LOW
Vitamin B-12: 292 pg/mL (ref 200–1100)

## 2021-06-16 LAB — TEST AUTHORIZATION

## 2021-12-07 NOTE — Progress Notes (Signed)
Name: Marcus Gray   MRN: [id number]    DOB: 09-12-1955   Date:12/08/2021 ? ?     Progress Note ? ?Subjective ? ?Chief Complaint ? ?Follow Up ? ?HPI ? ?CAD: patient had acute MI, with chest pain and dizziness, went to hospital in [place] back in 2016. He was admitted and had two stents placed. He moved to [facility] about 2018 and ran out of medications.. He denies chest pain, palpitation or SOB, he occasionally has shoulder heaviness but very seldom now, denies orthopnea .   He is now under the care of Dr. Saunders Revel. He is  on metoprolol, he is off atorvastatin and explained importance of resuming medication  and continue aspirin > he has echo that showed ischemic cardiomyopathy  ? ?FINDINGS  ? Left Ventricle: Left ventricular ejection fraction, by estimation, is 40  ?to 45%. The left ventricle has mildly decreased function. The left  ?ventricle has no regional wall motion abnormalities. The left ventricular  ?internal cavity size was normal in  ?size. There is no left ventricular hypertrophy. Left ventricular diastolic  ?parameters are consistent with Grade I diastolic dysfunction (impaired  ?relaxation). ? 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 He is stable, he is off citalopram due to nausea, and feels fine now.  ? ?Gout:  he had uric acid checked 06/22 and it was elevated at 7.5 , he states unable to take Allopurinol due to nausea and indigestion No recent episodes of gout  ? ?Smoker's cough: he smoked 1-2 pack daily for 20 years ( he worked in [place] for [employer] trying out cigarettes for about 4 years) stopped for 34 years and resumed smoking about 1 year after his MI, down to about 4 cigarettes daily.He has a smoker's cough, usually wet in the mornings, no wheezing or sob. He is not interested in using inhalers, reminded him to try to quit again.  Discussed CT lung screen  ? ?HTN: bp is towards low end of normal but only taking Metoprolol, he states has some dizziness when he stands up quickly, also taking metoprolol due to history of MI  ?RA: he was on  Plaquenil  and saw Dr. Posey Pronto, but he states not having any pain or swelling except for right shoulder and not interested in going back on medication or seeing Rheumatologist at this time.  ? ?Indigestion: he stopped taking most medications ( celexa, atorvastatin, allopurinol , plaquenil and Lotrel) due to vomiting after taking medications. He states no vomiting or indigestion since. Explained the need to resume atorvastatin. Also discussed referral to GI but he states feeling well and not interested  ? ?B2 and folate deficiency: taking B12 however has not started folic acid, we will recheck labs today ? ?Dysuria: noticed one week ago, noticed frequency but incomplete void. No fever , chills or back pain No sexually active  ? ?Patient Active Problem List  ? Diagnosis Date Noted  ? Smokers' cough ([place]) 12/08/2021  ? Presence of stent in coronary artery 12/08/2021  ? History of MI (myocardial infarction) 12/08/2021  ? Abnormal red blood cells 12/08/2021  ? Low folic acid 08/65/7846  ? B12 deficiency 12/08/2021  ? Mild episode of recurrent major depressive disorder ([place]) 12/08/2021  ? Long-term use of immunosuppressant medication 01/17/2021  ? Rheumatoid arthritis of multiple sites with negative rheumatoid factor (  [place]) 11/19/2020  ? Idiopathic chronic gout of multiple sites without tophus 11/17/2020  ? Gout of multiple sites 10/13/2020  ? Major depression in remission ([place]) 10/13/2020  ? Diastolic dysfunction 40/81/4481  ? Coronary artery disease involving native coronary artery of native heart without angina pectoris 06/17/2020  ? Hypertension, benign 06/17/2020  ? Hyperlipidemia LDL goal <70 06/17/2020  ? ? ?Past Surgical History:  ?Procedure Laterality Date  ? CARDIAC CATHETERIZATION    ? COLONOSCOPY WITH  PROPOFOL N/A 10/07/2020  ? Procedure: COLONOSCOPY WITH PROPOFOL;  Surgeon: [name], MD;  Location[facility] ENDOSCOPY;  Service: Gastroenterology;  Laterality: N/A;  ? CORONARY STENT PLACEMENT  2016  ? [place]; OM1 and RCA  ? ? ?Family History  ?Problem Relation Age of Onset  ? Diabetes Mother   ? Emphysema Father   ? Cerebral aneurysm Daughter   ? ? ?Social History  ? ?Tobacco Use  ? Smoking status: Every Day  ?  Packs/day: 1.00  ?  Types: Cigarettes  ?  Start date: 09/04/1968  ? Smokeless tobacco: Never  ?Substance Use Topics  ? Alcohol use: Yes  ?  Alcohol/week: 5.0 standard drinks  ?  Types: 5 Glasses of wine per week  ?  Comment: wine at night  ? ? ? ?Current Outpatient Medications:  ?  aspirin EC 81 MG tablet, Take 1 tablet (81 mg total) by mouth daily., Disp: 30 tablet, Rfl: 0 ?  cholecalciferol (VITAMIN D3) 25 MCG (1000 UNIT) tablet, Take 1,000 Units by mouth daily., Disp: , Rfl:  ?  colchicine 0.6 MG tablet, Take 2 tablets (1.2 mg total) by mouth daily as needed. And may repeat in 24 hours if no improvement, after that may stop medication and take prn, Disp: 30 tablet, Rfl: 0 ?  Cyanocobalamin (B-12) 500 MCG SUBL, Place 1 tablet under the tongue daily at 12 noon., Disp: , Rfl:  ?  folic acid (FOLVITE) 1 MG tablet, Take 1 mg by mouth daily., Disp: , Rfl:  ?  atorvastatin (LIPITOR) 80 MG tablet, Take 1 tablet (80 mg total) by mouth daily., Disp: 90 tablet, Rfl: 1 ?  famotidine (PEPCID) 40 MG tablet, Take 1 tablet (40 mg total) by mouth daily., Disp: 89 tablet, Rfl: 2 ?  metoprolol succinate (TOPROL-XL) 25 MG 24 hr tablet, Take 1 tablet (25 mg total) by mouth daily., Disp: 90 tablet, Rfl: 1 ? ?No Known Allergies ? ?I personally reviewed active problem list, medication list, allergies, family history, social history, health maintenance with the patient/caregiver today. ? ? ?ROS ? ?Constitutional: Negative for fever or weight change.  ?Respiratory: Negative for cough and shortness of breath.    ?Cardiovascular: Negative for chest pain or palpitations.  ?Gastrointestinal: Negative for abdominal pain, no bowel changes.  ?Musculoskeletal: Negative for gait problem or joint swelling.  ?Skin: Negative for rash.  ?Neurological: Negative for dizziness or headache.  ?No other specific complaints in a complete review of systems (except as listed in HPI above).  ? ?Objective ? ?Vitals:  ? 12/08/21 0940  ?BP: 110/78  ?Pulse: 85  ?Resp: 16  ?SpO2: 98%  ?Weight: 178 lb (80.7 kg)  ?Height: '5\' 8"'$  (1.727 m)  ? ? ?Body mass index is 27.06 kg/m?. ? ?Physical Exam ? ?Constitutional: Patient appears well-developed and well-nourished.  No distress.  ?HEENT: head atraumatic, normocephalic, pupils equal and reactive to light, neck supple ?Cardiovascular: Normal rate, regular rhythm and normal heart sounds.  No murmur heard. No BLE edema. ?Pulmonary/Chest: Effort normal and breath sounds normal.  No respiratory distress. ?Abdominal: Soft.  There is no tenderness.Negative CVA tenderness  ?Psychiatric: Patient has a normal mood and affect. behavior is normal. Judgment and thought content normal.  ? ? ?PHQ2/9: ? ?  12/08/2021  ? 10:00 AM 06/09/2021  ? 10:57 AM 05/30/2021  ?  9:42 AM 01/14/2021  ? 11:33 AM 10/13/2020  ? 11:21 AM  ?Depression screen PHQ 2/9  ?Decreased Interest 1 0 0 0 0  ?Down, Depressed, Hopeless 0 0 0 0 0  ?PHQ - 2 Score 1 0 0 0 0  ?Altered sleeping 0 0 0 0   ?Tired, decreased energy 1 0 0 0   ?Change in appetite 1 0 0 0   ?Feeling bad or failure about yourself  0 0 0 0   ?Trouble concentrating 0 0 0 0   ?Moving slowly or fidgety/restless 0 0 0 0   ?Suicidal thoughts 0 0 0 0   ?PHQ-9 Score 3 0 0 0   ?Difficult doing work/chores Somewhat difficult  Not difficult at all    ?  ?phq 9 is positive ? ? ?Fall Risk: ? ?  12/08/2021  ?  9:40 AM 06/09/2021  ? 10:57 AM 05/30/2021  ?  9:36 AM 01/14/2021  ? 11:32 AM 10/13/2020  ? 11:21 AM  ?Fall Risk   ?Falls in the past year? 0 0 0 0 0  ?Number falls in past yr: 0 0 0 0 0  ?Injury  with Fall? 0 0 0 0 0  ?Risk for fall due to : No Fall Risks No Fall Risks     ?Follow up Falls prevention discussed Falls prevention discussed     ? ? ? ? ?Functional Status Survey: ?Is the patient deaf or have difficulty hearin

## 2021-12-08 ENCOUNTER — Encounter: Payer: Self-pay | Admitting: Family Medicine

## 2021-12-08 ENCOUNTER — Ambulatory Visit (INDEPENDENT_AMBULATORY_CARE_PROVIDER_SITE_OTHER): Payer: Medicare Other | Admitting: Family Medicine

## 2021-12-08 VITALS — BP 110/78 | HR 85 | Resp 16 | Ht 68.0 in | Wt 178.0 lb

## 2021-12-08 DIAGNOSIS — Z720 Tobacco use: Secondary | ICD-10-CM

## 2021-12-08 DIAGNOSIS — M0609 Rheumatoid arthritis without rheumatoid factor, multiple sites: Secondary | ICD-10-CM | POA: Diagnosis not present

## 2021-12-08 DIAGNOSIS — J410 Simple chronic bronchitis: Secondary | ICD-10-CM

## 2021-12-08 DIAGNOSIS — F330 Major depressive disorder, recurrent, mild: Secondary | ICD-10-CM

## 2021-12-08 DIAGNOSIS — I255 Ischemic cardiomyopathy: Secondary | ICD-10-CM

## 2021-12-08 DIAGNOSIS — R300 Dysuria: Secondary | ICD-10-CM

## 2021-12-08 DIAGNOSIS — I420 Dilated cardiomyopathy: Secondary | ICD-10-CM

## 2021-12-08 DIAGNOSIS — I2510 Atherosclerotic heart disease of native coronary artery without angina pectoris: Secondary | ICD-10-CM

## 2021-12-08 DIAGNOSIS — E785 Hyperlipidemia, unspecified: Secondary | ICD-10-CM

## 2021-12-08 DIAGNOSIS — R718 Other abnormality of red blood cells: Secondary | ICD-10-CM

## 2021-12-08 DIAGNOSIS — I252 Old myocardial infarction: Secondary | ICD-10-CM

## 2021-12-08 DIAGNOSIS — I10 Essential (primary) hypertension: Secondary | ICD-10-CM

## 2021-12-08 DIAGNOSIS — E538 Deficiency of other specified B group vitamins: Secondary | ICD-10-CM | POA: Insufficient documentation

## 2021-12-08 DIAGNOSIS — Z955 Presence of coronary angioplasty implant and graft: Secondary | ICD-10-CM

## 2021-12-08 MED ORDER — METOPROLOL SUCCINATE ER 25 MG PO TB24: 25.0000 mg | ORAL_TABLET | Freq: Every day | ORAL | 1 refills | Status: DC

## 2021-12-08 MED ORDER — CIPROFLOXACIN HCL 250 MG PO TABS: 250.0000 mg | ORAL_TABLET | Freq: Two times a day (BID) | ORAL | 0 refills | Status: AC

## 2021-12-08 MED ORDER — ATORVASTATIN CALCIUM 80 MG PO TABS: 80.0000 mg | ORAL_TABLET | Freq: Every day | ORAL | 1 refills | Status: DC

## 2021-12-08 NOTE — Assessment & Plan Note (Signed)
He is smoking a few cigarettes daily but advised to quit  ?

## 2021-12-08 NOTE — Assessment & Plan Note (Signed)
bp towards low end of normal , only on metoprolol  ?

## 2021-12-08 NOTE — Assessment & Plan Note (Signed)
He stopped taking medications ?Not willing to go back to see Dr. Posey Pronto  ?

## 2021-12-08 NOTE — Assessment & Plan Note (Signed)
Must resume statin therapy, advised to crush and take it with apple sauce  ?

## 2021-12-08 NOTE — Assessment & Plan Note (Signed)
2016 , stent placed  ?Explained importance of taking statins again ?

## 2021-12-08 NOTE — Assessment & Plan Note (Signed)
Must start supplementation  ?

## 2021-12-09 ENCOUNTER — Other Ambulatory Visit: Payer: Self-pay | Admitting: Family Medicine

## 2021-12-09 DIAGNOSIS — Z720 Tobacco use: Secondary | ICD-10-CM

## 2021-12-09 DIAGNOSIS — J410 Simple chronic bronchitis: Secondary | ICD-10-CM

## 2021-12-13 ENCOUNTER — Other Ambulatory Visit: Payer: Self-pay

## 2021-12-13 ENCOUNTER — Other Ambulatory Visit: Payer: Self-pay | Admitting: Family Medicine

## 2021-12-13 DIAGNOSIS — E538 Deficiency of other specified B group vitamins: Secondary | ICD-10-CM

## 2021-12-13 DIAGNOSIS — R739 Hyperglycemia, unspecified: Secondary | ICD-10-CM

## 2021-12-13 MED ORDER — FOLIC ACID 1 MG PO TABS: 1.0000 mg | ORAL_TABLET | Freq: Two times a day (BID) | ORAL | 1 refills | Status: DC

## 2021-12-13 MED ORDER — B-12 500 MCG SL SUBL: 1.0000 | SUBLINGUAL_TABLET | Freq: Every day | SUBLINGUAL | 1 refills | Status: AC

## 2021-12-13 NOTE — Progress Notes (Signed)
added

## 2021-12-15 LAB — CBC WITH DIFFERENTIAL/PLATELET
Absolute Monocytes: 1032 cells/uL — ABNORMAL HIGH (ref 200–950)
Basophils Absolute: 48 cells/uL (ref 0–200)
Basophils Relative: 0.6 %
Eosinophils Absolute: 64 cells/uL (ref 15–500)
Eosinophils Relative: 0.8 %
HCT: 50.6 % — ABNORMAL HIGH (ref 38.5–50.0)
Hemoglobin: 17.8 g/dL — ABNORMAL HIGH (ref 13.2–17.1)
Lymphs Abs: 2232 cells/uL (ref 850–3900)
MCH: 36.6 pg — ABNORMAL HIGH (ref 27.0–33.0)
MCHC: 35.2 g/dL (ref 32.0–36.0)
MCV: 103.9 fL — ABNORMAL HIGH (ref 80.0–100.0)
MPV: 11.9 fL (ref 7.5–12.5)
Monocytes Relative: 12.9 %
Neutro Abs: 4624 cells/uL (ref 1500–7800)
Neutrophils Relative %: 57.8 %
Platelets: 197 10*3/uL (ref 140–400)
RBC: 4.87 10*6/uL (ref 4.20–5.80)
RDW: 12.8 % (ref 11.0–15.0)
Total Lymphocyte: 27.9 %
WBC: 8 10*3/uL (ref 3.8–10.8)

## 2021-12-15 LAB — TEST AUTHORIZATION

## 2021-12-15 LAB — HEMOGLOBIN A1C
Hgb A1c MFr Bld: 5.9 % of total Hgb — ABNORMAL HIGH (ref <5.7)
Mean Plasma Glucose: 123 mg/dL
eAG (mmol/L): 6.8 mmol/L

## 2021-12-15 LAB — B12 AND FOLATE PANEL
Folate: 2.2 ng/mL — ABNORMAL LOW
Vitamin B-12: 498 pg/mL (ref 200–1100)

## 2021-12-15 LAB — CULTURE, URINE COMPREHENSIVE
MICRO NUMBER:: 13386409
RESULT:: NO GROWTH
SPECIMEN QUALITY:: ADEQUATE

## 2021-12-15 LAB — SEDIMENTATION RATE: Sed Rate: 14 mm/h (ref 0–20)

## 2021-12-15 LAB — C-REACTIVE PROTEIN: CRP: 38.8 mg/L — ABNORMAL HIGH (ref <8.0)

## 2022-02-07 ENCOUNTER — Telehealth: Payer: Self-pay | Admitting: Acute Care

## 2022-02-07 NOTE — Telephone Encounter (Signed)
Contacted patient by phone (with daughter to assist with interpretation) regarding LCS referral.  Patient is interested in getting a CT chest due to smoking history, as recommended by Dr. Ancil Boozer.  Screening questionnaire regarding smoking history does not meet requirements for pack years to have a LCS LDCT.  Patient quit smoking for 36 years and is a very light smoker.  His pack years of 5 will not meet LCS guidelines.  Informed patient/daughter that we will inform Dr. Ancil Boozer of his ineligibility for LCS LDCT and she can review/recommend if she would like to order a CT Chest wo contrast as alternative imaging.  Patient would like to proceed with CT if PCP agrees.  Referral closed for LCS.

## 2022-03-14 ENCOUNTER — Encounter: Payer: Medicare Other | Admitting: Family Medicine

## 2022-05-17 ENCOUNTER — Emergency Department: Payer: Medicare Other

## 2022-05-17 ENCOUNTER — Encounter: Payer: Self-pay | Admitting: Radiology

## 2022-05-17 ENCOUNTER — Other Ambulatory Visit: Payer: Self-pay

## 2022-05-17 ENCOUNTER — Emergency Department
Admission: EM | Admit: 2022-05-17 | Discharge: 2022-05-17 | Disposition: A | Discharge status: Home / Self Care | Payer: Medicare Other | Attending: Emergency Medicine | Admitting: Emergency Medicine

## 2022-05-17 ENCOUNTER — Ambulatory Visit: Payer: Self-pay | Admitting: *Deleted

## 2022-05-17 DIAGNOSIS — R1012 Left upper quadrant pain: Secondary | ICD-10-CM | POA: Diagnosis not present

## 2022-05-17 DIAGNOSIS — I10 Essential (primary) hypertension: Secondary | ICD-10-CM | POA: Insufficient documentation

## 2022-05-17 DIAGNOSIS — R1011 Right upper quadrant pain: Secondary | ICD-10-CM | POA: Diagnosis not present

## 2022-05-17 DIAGNOSIS — I2510 Atherosclerotic heart disease of native coronary artery without angina pectoris: Secondary | ICD-10-CM | POA: Insufficient documentation

## 2022-05-17 DIAGNOSIS — Z20822 Contact with and (suspected) exposure to covid-19: Secondary | ICD-10-CM | POA: Insufficient documentation

## 2022-05-17 DIAGNOSIS — M546 Pain in thoracic spine: Secondary | ICD-10-CM | POA: Diagnosis not present

## 2022-05-17 DIAGNOSIS — J40 Bronchitis, not specified as acute or chronic: Secondary | ICD-10-CM | POA: Diagnosis not present

## 2022-05-17 DIAGNOSIS — R0789 Other chest pain: Secondary | ICD-10-CM

## 2022-05-17 DIAGNOSIS — R079 Chest pain, unspecified: Secondary | ICD-10-CM | POA: Diagnosis present

## 2022-05-17 LAB — URINALYSIS, ROUTINE W REFLEX MICROSCOPIC
Bilirubin Urine: NEGATIVE
Glucose, UA: NEGATIVE mg/dL
Hgb urine dipstick: NEGATIVE
Ketones, ur: 5 mg/dL — AB
Leukocytes,Ua: NEGATIVE
Nitrite: NEGATIVE
Protein, ur: NEGATIVE mg/dL
Specific Gravity, Urine: >1.046 — ABNORMAL HIGH (ref 1.005–1.030)
pH: 5 (ref 5.0–8.0)

## 2022-05-17 LAB — HEPATIC FUNCTION PANEL
ALT: 21 U/L (ref 0–44)
AST: 27 U/L (ref 15–41)
Albumin: 3.8 g/dL (ref 3.5–5.0)
Alkaline Phosphatase: 63 U/L (ref 38–126)
Bilirubin, Direct: 0.3 mg/dL — ABNORMAL HIGH (ref 0.0–0.2)
Indirect Bilirubin: 0.7 mg/dL (ref 0.3–0.9)
Total Bilirubin: 1 mg/dL (ref 0.3–1.2)
Total Protein: 7.2 g/dL (ref 6.5–8.1)

## 2022-05-17 LAB — CBC
HCT: 52.1 % — ABNORMAL HIGH (ref 39.0–52.0)
Hemoglobin: 17.7 g/dL — ABNORMAL HIGH (ref 13.0–17.0)
MCH: 34.5 pg — ABNORMAL HIGH (ref 26.0–34.0)
MCHC: 34 g/dL (ref 30.0–36.0)
MCV: 101.6 fL — ABNORMAL HIGH (ref 80.0–100.0)
Platelets: 135 10*3/uL — ABNORMAL LOW (ref 150–400)
RBC: 5.13 MIL/uL (ref 4.22–5.81)
RDW: 12.8 % (ref 11.5–15.5)
WBC: 6.8 10*3/uL (ref 4.0–10.5)
nRBC: 0 % (ref 0.0–0.2)

## 2022-05-17 LAB — TROPONIN I (HIGH SENSITIVITY)
Troponin I (High Sensitivity): 8 ng/L (ref <18)
Troponin I (High Sensitivity): 9 ng/L (ref <18)

## 2022-05-17 LAB — LIPASE, BLOOD: Lipase: 38 U/L (ref 11–51)

## 2022-05-17 LAB — BASIC METABOLIC PANEL
Anion gap: 8 (ref 5–15)
BUN: 7 mg/dL — ABNORMAL LOW (ref 8–23)
CO2: 22 mmol/L (ref 22–32)
Calcium: 9.4 mg/dL (ref 8.9–10.3)
Chloride: 110 mmol/L (ref 98–111)
Creatinine, Ser: 0.96 mg/dL (ref 0.61–1.24)
GFR, Estimated: >60 mL/min (ref >60)
Glucose, Bld: 131 mg/dL — ABNORMAL HIGH (ref 70–99)
Potassium: 4.2 mmol/L (ref 3.5–5.1)
Sodium: 140 mmol/L (ref 135–145)

## 2022-05-17 LAB — RESP PANEL BY RT-PCR (FLU A&B, COVID) ARPGX2
Influenza A by PCR: NEGATIVE
Influenza B by PCR: NEGATIVE
SARS Coronavirus 2 by RT PCR: NEGATIVE

## 2022-05-17 MED ORDER — ALBUTEROL SULFATE HFA 108 (90 BASE) MCG/ACT IN AERS: 2.0000 | INHALATION_SPRAY | RESPIRATORY_TRACT | 0 refills | Status: AC | PRN

## 2022-05-17 MED ORDER — AZITHROMYCIN 500 MG PO TABS
500.0000 mg | ORAL_TABLET | Freq: Once | ORAL | Status: AC
  Administered 2022-05-17: 500 mg via ORAL
  Filled 2022-05-17: qty 1

## 2022-05-17 MED ORDER — ONDANSETRON 4 MG PO TBDP: 4.0000 mg | ORAL_TABLET | Freq: Three times a day (TID) | ORAL | 0 refills | Status: DC | PRN

## 2022-05-17 MED ORDER — IOHEXOL 350 MG/ML SOLN
100.0000 mL | Freq: Once | INTRAVENOUS | Status: AC | PRN
  Administered 2022-05-17: 100 mL via INTRAVENOUS

## 2022-05-17 MED ORDER — AZITHROMYCIN 250 MG PO TABS: 250.0000 mg | ORAL_TABLET | Freq: Every day | ORAL | 0 refills | Status: AC

## 2022-05-17 NOTE — Telephone Encounter (Signed)
Reason for Disposition  Patient sounds very sick or weak to the triager    Having pain in the "back of his ribs"   History of bypass surgery.   This pain feels the same.  Answer Assessment - Initial Assessment Questions 1. RESPIRATORY STATUS: "Describe your breathing?" (e.g., wheezing, shortness of breath, unable to speak, severe coughing)      [name] granddaughter calling for her step father. He is having pain at night in his ribs and lungs.   Pain in his hands.   Not having difficulty breathing but it's painful.   He just got back from [place] a few weeks ago.   No health changes.   He had a minor cold due to change of temperature arriving here.     [name] is interpreting for him. In the back of his ribs he is hurting.   No chest pain.   No injuries. 2. ONSET: "When did this breathing problem begin?"      A month 1/2 ago.   Started in [place].    A mild cough and a little sore throat.   He is a smoker.    3. PATTERN "Does the difficult breathing come and go, or has it been constant since it started?"      More constant but it does sometimes happen on occasion. 4. SEVERITY: "How bad is your breathing?" (e.g., mild, moderate, severe)    - MILD: No SOB at rest, mild SOB with walking, speaks normally in sentences, can lie down, no retractions, pulse < 100.    - MODERATE: SOB at rest, SOB with minimal exertion and prefers to sit, cannot lie down flat, speaks in phrases, mild retractions, audible wheezing, pulse 100-120.    - SEVERE: Very SOB at rest, speaks in single words, struggling to breathe, sitting hunched forward, retractions, pulse > 120      Denies shortness of breath    Just the pain. 5. RECURRENT SYMPTOM: "Have you had difficulty breathing before?" If Yes, ask: "When was the last time?" and "What happened that time?"      Not had before 6. CARDIAC HISTORY: "Do you have any history of heart disease?" (e.g., heart attack, angina, bypass surgery, angioplasty)      He has 2 bypasses.    He feels  really tired. 7. LUNG HISTORY: "Do you have any history of lung disease?"  (e.g., pulmonary embolus, asthma, emphysema)     No   but he does smoke 8. CAUSE: "What do you think is causing the breathing problem?"      He is having diarrhea and vomiting but not today.    It was a week ago he had this. 9. OTHER SYMPTOMS: "Do you have any other symptoms? (e.g., dizziness, runny nose, cough, chest pain, fever)     Tired.    This pain is similar to when he had problems with his heart before having the bypass surgery. 10. O2 SATURATION MONITOR:  "Do you use an oxygen saturation monitor (pulse oximeter) at home?" If Yes, ask: "What is your reading (oxygen level) today?" "What is your usual oxygen saturation reading?" (e.g., 95%)       N/A 11. PREGNANCY: "Is there any chance you are pregnant?" "When was your last menstrual period?"       N/A 12. TRAVEL: "Have you traveled out of the country in the last month?" (e.g., travel history, exposures)       Yes  was in his home town of [place] a  few weeks ago.  Protocols used: Breathing Difficulty-A-AH

## 2022-05-17 NOTE — ED Triage Notes (Signed)
Pt. To ED via POV for left sided CP x1 month, increasing in severity x3 days. Pt. States he flew from [place] 2 weeks ago. Pt. States pain radiates to back, abdomen, ribs, neck. C/o diarrhea and vomiting 3 days ago. Pt. States intermittent SOB with CP.

## 2022-05-17 NOTE — ED Provider Notes (Signed)
[facility] Provider Note    Event Date/Time   First MD Initiated Contact with Patient 05/17/22 1534     (approximate)   History   Back Pain and Chest Pain (Pt. To ED via POV for left sided CP x1 month, increasing in severity x3 days. Pt. States he flew from [place] 2 weeks ago. Pt. States pain radiates to back, abdomen, ribs, neck. C/o diarrhea and vomiting 3 days ago. Pt. States intermittent SOB with CP.)   HPI  HPI obtained via video interpreter  Marcus Gray is a 66 y.o. male with a history of CAD status post MI, hypertension, chronic cough, and gout who presents with lower chest and upper abdominal bilateral pain as well as mid back pain for the last 1.5 months.  The pain has been slightly worse in the last several days, and was associated with vomiting and diarrhea several days ago that has resolved.  He reports some shortness of breath especially at night.  Has had nonproductive cough but no fever.  He reports some body aches.  I reviewed the past medical records.  [name] most recent outpatient encounter was with Dr. Ancil Boozer on 5/11.  He has a history of CAD with MI status post PCI and stent placement in 2016.  He has hypertension, chronic cough, gout, and indigestion.   Physical Exam   Triage Vital Signs: ED Triage Vitals  Enc Vitals Group     BP 05/17/22 1204 (!) 183/124     Pulse Rate 05/17/22 1204 73     Resp 05/17/22 1204 18     Temp 05/17/22 1204 98.7 F (37.1 C)     Temp Source 05/17/22 1204 Oral     SpO2 05/17/22 1204 96 %     Weight 05/17/22 1206 174 lb 2.6 oz (79 kg)     Height 05/17/22 1206 5' 7.72" (1.72 m)     Head Circumference --      Peak Flow --      Pain Score 05/17/22 1205 5     Pain Loc --      Pain Edu? --      Excl. in [place]? --     Most recent vital signs: Vitals:   05/17/22 1830 05/17/22 2000  BP: (!) 153/102 (!) 188/102  Pulse: (!) 58 75  Resp: 15 16  Temp:  (!) 97.4 F (36.3 C)  SpO2: 97% 97%      General: Awake, no distress.  CV:  Good peripheral perfusion.  Normal heart sounds. Resp:  Normal effort.  Lungs CTAB. Abd:  No distention.  Mild bilateral upper quadrant discomfort with no focal tenderness or peritoneal signs. Other:  No peripheral edema.   ED Results / Procedures / Treatments   Labs (all labs ordered are listed, but only abnormal results are displayed) Labs Reviewed  BASIC METABOLIC PANEL - Abnormal; Notable for the following components:      Result Value   Glucose, Bld 131 (*)    BUN 7 (*)    All other components within normal limits  CBC - Abnormal; Notable for the following components:   Hemoglobin 17.7 (*)    HCT 52.1 (*)    MCV 101.6 (*)    MCH 34.5 (*)    Platelets 135 (*)    All other components within normal limits  HEPATIC FUNCTION PANEL - Abnormal; Notable for the following components:   Bilirubin, Direct 0.3 (*)    All other components within normal  limits  URINALYSIS, ROUTINE W REFLEX MICROSCOPIC - Abnormal; Notable for the following components:   Color, Urine YELLOW (*)    APPearance CLEAR (*)    Specific Gravity, Urine >1.046 (*)    Ketones, ur 5 (*)    All other components within normal limits  RESP PANEL BY RT-PCR (FLU A&B, COVID) ARPGX2  LIPASE, BLOOD  TROPONIN I (HIGH SENSITIVITY)  TROPONIN I (HIGH SENSITIVITY)     EKG  ED ECG REPORT I, Arta Silence, the attending physician, personally viewed and interpreted this ECG.  Date: 05/17/2022 EKG Time: 1157 Rate: 70 Rhythm: normal sinus rhythm with PVCs QRS Axis: normal Intervals: normal ST/T Wave abnormalities: Nonspecific ST abnormalities Narrative Interpretation: Nonspecific abnormalities with no evidence of acute ischemia; no significant change when compared to EKG of 08/24/2020   RADIOLOGY  Chest x-ray: I independently viewed and interpreted the images; there is no focal consolidation or edema  CT angio chest/abdomen/pelvis: Right upper lobe bronchiolitis, no  other acute abnormality   PROCEDURES:  Critical Care performed: No  Procedures   MEDICATIONS ORDERED IN ED: Medications  iohexol (OMNIPAQUE) 350 MG/ML injection 100 mL (100 mLs Intravenous Contrast Given 05/17/22 1637)  azithromycin (ZITHROMAX) tablet 500 mg (500 mg Oral Given 05/17/22 2015)     IMPRESSION / MDM / ASSESSMENT AND PLAN / ED COURSE  I reviewed the triage vital signs and the nursing notes.  66 year old male with PMH as noted above presents with subacute symptoms including chest and upper abdominal pain rating to the back as well as some shortness of breath and cough.  He had some GI symptoms few days ago that resolved.  Physical exam is unremarkable.  The patient has no significant tenderness.  He is somewhat hypertensive although reports he is compliant with his antihypertensives.  Differential diagnosis includes, but is not limited to, viral syndrome, pneumonia, bronchitis, other infectious etiology, cholelithiasis, pancreatitis, ACS, less likely PE, aortic dissection, or other vascular etiology.  Initial labs show normal troponin, slightly elevated hemoglobin which is baseline for the patient, and normal chemistry.  I have added on hepatobiliary labs, urinalysis, and respiratory panel.  We will obtain a CT angio chest/abdomen/pelvis to evaluate for vascular etiology.  Patient's presentation is most consistent with acute presentation with potential threat to life or bodily function.  The patient is on the cardiac monitor to evaluate for evidence of arrhythmia and/or significant heart rate changes.  ----------------------------------------- 8:07 PM on 05/17/2022 -----------------------------------------   LFTs and lipase are within normal limits.  Urinalysis is negative.  Respiratory panel is negative.  CT angio does not show any acute findings other than some possible bronchiolitis or inflammatory findings in the right upper lung.  On reassessment the patient  continues to appear comfortable.  He has no respiratory distress or other acute symptoms.  I will treat empirically for possible bronchitis/atypical pneumonia with azithromycin.  I counseled the patient on the results of the work-up via the [place] video interpreter, answered all the patient's questions, discussed the plan of care and follow-up, and gave him strict return precautions; he expressed understanding.  He is stable for discharge at this time.   FINAL CLINICAL IMPRESSION(S) / ED DIAGNOSES   Final diagnoses:  Atypical chest pain  Bilateral thoracic back pain, unspecified chronicity  Bronchitis     Rx / DC Orders   ED Discharge Orders          Ordered    azithromycin (ZITHROMAX) 250 MG tablet  Daily  05/17/22 2006    ondansetron (ZOFRAN-ODT) 4 MG disintegrating tablet  Every 8 hours PRN        05/17/22 2006    albuterol (VENTOLIN HFA) 108 (90 Base) MCG/ACT inhaler  Every 4 hours PRN        05/17/22 2006             Note:  This document was prepared using Dragon voice recognition software and may include unintentional dictation errors.    Arta Silence, MD 05/17/22 [phone]

## 2022-05-17 NOTE — ED Notes (Signed)
Pts requested a [place] interpreter

## 2022-05-17 NOTE — Telephone Encounter (Addendum)
  Chief Complaint: C/O "pain in the back of his ribs"  similar to pain he had prior to his bypass surgery.  He has had 2 bypass surgeries. Symptoms: Tired and having pain in the back of his ribs. Frequency: Mostly constantly but does happen intermittently.   Pain started while he was in [place].  Mild sore throat and cough.   Smoker Pertinent Negatives: Patient denies shortness of breath, breaking out in a sweat or recent illness.  Mild cold when he first arrived in [place] Disposition: '[x]'$ ED /'[]'$ Urgent Care (no appt availability in office) / '[]'$ Appointment(In office/virtual)/ '[]'$  [facility]/ '[]'$ Home Care/ '[]'$ Refused Recommended Disposition /'[]'$ [facility]/ '[]'$  Follow-up with PCP Additional Notes: Referred to the ED based on his symptoms and cardiac history.   Pt agreeable to going.   Granddaughter, [name] said she would call his wife and they would go to [facility]. I sent my notes to [place] [facility] for Dr. Ancil Boozer.  I also called into [facility] and spoke with Cassandra letting her know of the ED referral.  She asked that I sent my notes.

## 2022-05-17 NOTE — ED Notes (Signed)
Triage translator, [place]ese, [place]  Anabella #[phone]

## 2022-05-18 ENCOUNTER — Other Ambulatory Visit: Payer: Self-pay | Admitting: Gastroenterology

## 2022-05-29 ENCOUNTER — Telehealth: Payer: Self-pay

## 2022-05-29 NOTE — Telephone Encounter (Unsigned)
Copied from [place] [id number]. Topic: Complaint - Care >> May 29, 2022  8:35 AM Tiffany B wrote: Osvaldo Human was transerred to the patient experience line at [phone] because her father received poor care specificially from the nurse while he was seen at [facility] on 05/17/2022.  Caller states patient and spouse who were present at the time of incident are spanish speaking therefore they may have been a language barrier. Caller states nurse was unprofessional and yanked patient IV out of patient arm. Caller scheduled appointment with PCP for 05/30/2022 due to injection site being sore and painful.

## 2022-05-29 NOTE — Progress Notes (Unsigned)
Name: Marcus Gray   MRN: [id number]    DOB: 11-Jul-1956   Date:05/30/2022       Progress Note  Subjective  Chief Complaint  Acute Post-IV site inflammation/soreness  HPI  Recent visit to [facility], on October 18 th for evaluation of back and chest pain. He has a history of MI. Reviewed labs and studies. His troponin levels were normal, he was sent home with antibiotics for treatment of bronchitis. He is feeling better , no chest pain, bu came in since BP at home in the mornings has been elevated but around 130-140 in the pm's. He is also upset and concerned about tenderness on left arm , where he had his IV - he felt the nurse mistreated him when she removed the IV.   He states nausea resolved, no SOB or wheezing, cough also improved, no chest pain. Denies headaches or dizziness   Patient Active Problem List   Diagnosis Date Noted   Smokers' cough ([place]) 12/08/2021   Presence of stent in coronary artery 12/08/2021   History of MI (myocardial infarction) 12/08/2021   Abnormal red blood cells 62/13/0865   Low folic acid 78/46/9629   B12 deficiency 12/08/2021   Mild episode of recurrent major depressive disorder ([place]) 12/08/2021   Ischemic dilated cardiomyopathy ([place]) 12/08/2021   Long-term use of immunosuppressant medication 01/17/2021   Rheumatoid arthritis of multiple sites with negative rheumatoid factor ([place]) 11/19/2020   Idiopathic chronic gout of multiple sites without tophus 11/17/2020   Gout of multiple sites 10/13/2020   Major depression in remission ([place]) 52/84/1324   Diastolic dysfunction 40/04/2724   Coronary artery disease involving native coronary artery of native heart without angina pectoris 06/17/2020   Hypertension, benign 06/17/2020   Hyperlipidemia LDL goal <70 06/17/2020    Past Surgical History:  Procedure Laterality Date   CARDIAC CATHETERIZATION     COLONOSCOPY WITH PROPOFOL N/A 10/07/2020   Procedure: COLONOSCOPY WITH PROPOFOL;  Surgeon: [name], MD;   Location: [facility] ENDOSCOPY;  Service: Gastroenterology;  Laterality: N/A;   CORONARY STENT PLACEMENT  2016   [place]; OM1 and RCA    Family History  Problem Relation Age of Onset   Diabetes Mother    Emphysema Father    Cerebral aneurysm Daughter     Social History   Tobacco Use   Smoking status: Every Day    Packs/day: 1.00    Types: Cigarettes    Start date: 09/04/1968   Smokeless tobacco: Never  Substance Use Topics   Alcohol use: Yes    Alcohol/week: 5.0 standard drinks of alcohol    Types: 5 Glasses of wine per week    Comment: wine at night     Current Outpatient Medications:    albuterol (VENTOLIN HFA) 108 (90 Base) MCG/ACT inhaler, Inhale 2 puffs into the lungs every 4 (four) hours as needed for shortness of breath., Disp: 8 g, Rfl: 0   aspirin EC 81 MG tablet, Take 1 tablet (81 mg total) by mouth daily., Disp: 30 tablet, Rfl: 0   atorvastatin (LIPITOR) 80 MG tablet, Take 1 tablet (80 mg total) by mouth daily., Disp: 90 tablet, Rfl: 1   cholecalciferol (VITAMIN D3) 25 MCG (1000 UNIT) tablet, Take 1,000 Units by mouth daily., Disp: , Rfl:    colchicine 0.6 MG tablet, Take 2 tablets (1.2 mg total) by mouth daily as needed. And may repeat in 24 hours if no improvement, after that may stop medication and take prn, Disp: 30 tablet, Rfl: 0  Cyanocobalamin (B-12) 500 MCG SUBL, Place 1 tablet under the tongue daily at 12 noon., Disp: 100 tablet, Rfl: 1   famotidine (PEPCID) 40 MG tablet, Take 1 tablet by mouth once daily, Disp: 90 tablet, Rfl: 3   folic acid (FOLVITE) 1 MG tablet, Take 1 tablet (1 mg total) by mouth 2 (two) times daily., Disp: 180 tablet, Rfl: 1   metoprolol succinate (TOPROL-XL) 25 MG 24 hr tablet, Take 1 tablet (25 mg total) by mouth 2 (two) times daily., Disp: 30 tablet, Rfl: 0  No Known Allergies  I personally reviewed active problem list, medication list, allergies, family history with the patient/caregiver today.   ROS  Ten systems reviewed and  is negative except as mentioned in HPI   Objective  Vitals:   05/30/22 0836 05/30/22 0846  BP: (!) 200/120 (!) 198/108  Pulse: 97   Resp: 14   Temp: 97.9 F (36.6 C)   TempSrc: Oral   SpO2: 97%   Weight: 184 lb 8 oz (83.7 kg)   Height: 5' 7.72" (1.72 m)     Body mass index is 28.29 kg/m.  Physical Exam  Constitutional: Patient appears well-developed and well-nourished.  No distress.  HEENT: head atraumatic, normocephalic, pupils equal and reactive to light, neck supple Cardiovascular: Normal rate, regular rhythm and normal heart sounds.  No murmur heard. Left arm, normal distal pulses, he has tender veins on antecubital area of left arm, some redness extending slightly to proximal arm.  Pulmonary/Chest: Effort normal and breath sounds normal. No respiratory distress. Abdominal: Soft.  There is no tenderness. Psychiatric: Patient has a normal mood and affect. behavior is normal. Judgment and thought content normal.   Recent Results (from the past 2160 hour(s))  Basic metabolic panel     Status: Abnormal   Collection Time: 05/17/22 12:08 PM  Result Value Ref Range   Sodium 140 135 - 145 mmol/L   Potassium 4.2 3.5 - 5.1 mmol/L   Chloride 110 98 - 111 mmol/L   CO2 22 22 - 32 mmol/L   Glucose, Bld 131 (H) 70 - 99 mg/dL    Comment: Glucose reference range applies only to samples taken after fasting for at least 8 hours.   BUN 7 (L) 8 - 23 mg/dL   Creatinine, Ser 0.96 0.61 - 1.24 mg/dL   Calcium 9.4 8.9 - 10.3 mg/dL   GFR, Estimated >60 >60 mL/Gray    Comment: (NOTE) Calculated using the CKD-EPI Creatinine Equation (2021)    Anion gap 8 5 - 15    Comment: Performed at [facility], [address] [place], [place] [address]  CBC     Status: Abnormal   Collection Time: 05/17/22 12:08 PM  Result Value Ref Range   WBC 6.8 4.0 - 10.5 K/uL   RBC 5.13 4.22 - 5.81 MIL/uL   Hemoglobin 17.7 (H) 13.0 - 17.0 g/dL   HCT 52.1 (H) 39.0 - 52.0 %   MCV 101.6 (H) 80.0 - 100.0  fL   MCH 34.5 (H) 26.0 - 34.0 pg   MCHC 34.0 30.0 - 36.0 g/dL   RDW 12.8 11.5 - 15.5 %   Platelets 135 (L) 150 - 400 K/uL   nRBC 0.0 0.0 - 0.2 %    Comment: Performed at [facility], [address] [place], [place] [address]  Troponin I (High Sensitivity)     Status: None   Collection Time: 05/17/22 12:08 PM  Result Value Ref Range   Troponin I (High Sensitivity)  8 <18 ng/L    Comment: (NOTE) Elevated high sensitivity troponin I (hsTnI) values and significant  changes across serial measurements may suggest ACS but many other  chronic and acute conditions are known to elevate hsTnI results.  Refer to the "Links" section for chest pain algorithms and additional  guidance. Performed at [facility], [place], [place] [address]   Troponin I (High Sensitivity)     Status: None   Collection Time: 05/17/22  4:18 PM  Result Value Ref Range   Troponin I (High Sensitivity) 9 <18 ng/L    Comment: (NOTE) Elevated high sensitivity troponin I (hsTnI) values and significant  changes across serial measurements may suggest ACS but many other  chronic and acute conditions are known to elevate hsTnI results.  Refer to the "Links" section for chest pain algorithms and additional  guidance. Performed at [facility], [address] [place], [place] [address]   Resp Panel by RT-PCR (Flu A&B, Covid) Anterior Nasal Swab     Status: None   Collection Time: 05/17/22  4:18 PM   Specimen: Anterior Nasal Swab  Result Value Ref Range   SARS Coronavirus 2 by RT PCR NEGATIVE NEGATIVE    Comment: (NOTE) SARS-CoV-2 target nucleic acids are NOT DETECTED.  The SARS-CoV-2 RNA is generally detectable in upper respiratory specimens during the acute phase of infection. The lowest concentration of SARS-CoV-2 viral copies this assay can detect is 138 copies/mL. A negative result does not preclude SARS-Cov-2 infection and should not be used as the sole basis for  treatment or other patient management decisions. A negative result may occur with  improper specimen collection/handling, submission of specimen other than nasopharyngeal swab, presence of viral mutation(s) within the areas targeted by this assay, and inadequate number of viral copies(<138 copies/mL). A negative result must be combined with clinical observations, patient history, and epidemiological information. The expected result is Negative.  Fact Sheet for Patients:  EntrepreneurPulse.com.au  Fact Sheet for Healthcare Providers:  IncredibleEmployment.be  This test is no t yet approved or cleared by the [place] FDA and  has been authorized for detection and/or diagnosis of SARS-CoV-2 by FDA under an Emergency Use Authorization (EUA). This EUA will remain  in effect (meaning this test can be used) for the duration of the COVID-19 declaration under Section 564(b)(1) of the Act, 21 U.S.C.section 360bbb-3(b)(1), unless the authorization is terminated  or revoked sooner.       Influenza A by PCR NEGATIVE NEGATIVE   Influenza B by PCR NEGATIVE NEGATIVE    Comment: (NOTE) The Xpert Xpress SARS-CoV-2/FLU/RSV plus assay is intended as an aid in the diagnosis of influenza from Nasopharyngeal swab specimens and should not be used as a sole basis for treatment. Nasal washings and aspirates are unacceptable for Xpert Xpress SARS-CoV-2/FLU/RSV testing.  Fact Sheet for Patients: EntrepreneurPulse.com.au  Fact Sheet for Healthcare Providers: IncredibleEmployment.be  This test is not yet approved or cleared by the [place] FDA and has been authorized for detection and/or diagnosis of SARS-CoV-2 by FDA under an Emergency Use Authorization (EUA). This EUA will remain in effect (meaning this test can be used) for the duration of the COVID-19 declaration under Section 564(b)(1) of the Act, 21 U.S.C. section  360bbb-3(b)(1), unless the authorization is terminated or revoked.  Performed at [facility], [address] [place],  [address]   Lipase, blood     Status: None   Collection Time: 05/17/22  4:18 PM  Result Value  Ref Range   Lipase 38 11 - 51 U/L    Comment: Performed at [facility], [address] [place], [place] [address]  Hepatic function panel     Status: Abnormal   Collection Time: 05/17/22  4:18 PM  Result Value Ref Range   Total Protein 7.2 6.5 - 8.1 g/dL   Albumin 3.8 3.5 - 5.0 g/dL   AST 27 15 - 41 U/L   ALT 21 0 - 44 U/L   Alkaline Phosphatase 63 38 - 126 U/L   Total Bilirubin 1.0 0.3 - 1.2 mg/dL   Bilirubin, Direct 0.3 (H) 0.0 - 0.2 mg/dL   Indirect Bilirubin 0.7 0.3 - 0.9 mg/dL    Comment: Performed at [facility], [address] [place], [place] [address]  Urinalysis, Routine w reflex microscopic Urine, Clean Catch     Status: Abnormal   Collection Time: 05/17/22  4:40 PM  Result Value Ref Range   Color, Urine YELLOW (A) YELLOW   APPearance CLEAR (A) CLEAR   Specific Gravity, Urine >1.046 (H) 1.005 - 1.030   pH 5.0 5.0 - 8.0   Glucose, UA NEGATIVE NEGATIVE mg/dL   Hgb urine dipstick NEGATIVE NEGATIVE   Bilirubin Urine NEGATIVE NEGATIVE   Ketones, ur 5 (A) NEGATIVE mg/dL   Protein, ur NEGATIVE NEGATIVE mg/dL   Nitrite NEGATIVE NEGATIVE   Leukocytes,Ua NEGATIVE NEGATIVE    Comment: Performed at [facility], [address] [place], [place] [address]    PHQ2/9:    05/30/2022    8:38 AM 12/08/2021   10:00 AM 06/09/2021   10:57 AM 05/30/2021    9:42 AM 01/14/2021   11:33 AM  Depression screen PHQ 2/9  Decreased Interest 0 1 0 0 0  Down, Depressed, Hopeless 0 0 0 0 0  PHQ - 2 Score 0 1 0 0 0  Altered sleeping 0 0 0 0 0  Tired, decreased energy 0 1 0 0 0  Change in appetite 0 1 0 0 0  Feeling bad or failure about yourself  0 0 0 0 0  Trouble concentrating 0 0 0 0 0  Moving slowly or fidgety/restless 0  0 0 0 0  Suicidal thoughts 0 0 0 0 0  PHQ-9 Score 0 3 0 0 0  Difficult doing work/chores  Somewhat difficult  Not difficult at all     phq 9 is negative   Fall Risk:    05/30/2022    8:38 AM 12/08/2021    9:40 AM 06/09/2021   10:57 AM 05/30/2021    9:36 AM 01/14/2021   11:32 AM  Fall Risk   Falls in the past year? 0 0 0 0 0  Number falls in past yr:  0 0 0 0  Injury with Fall?  0 0 0 0  Risk for fall due to :  No Fall Risks No Fall Risks    Follow up Falls prevention discussed;Education provided;Falls evaluation completed Falls prevention discussed Falls prevention discussed        Assessment & Plan  1. Phlebitis  Discussed warm compresses, nsaid's for 3 days 600 mg ibuprofen TID with food, discussed signs of PE and to contact us if redness and pain progresses proximally   2. Uncontrolled hypertension  - metoprolol succinate (TOPROL-XL) 25 MG 24 hr tablet; Take 1 tablet (25 mg total) by mouth 2 (two) times daily.  Dispense: 30 tablet; Refill: 0  His bp in the pm's are almost at goal, he  has been intolerant to multiple medications in the past, advised to take toprol xl BID and return in one week for follow up  He will also monitor bp at home and bring a log during his next visit   3. History of MI (myocardial infarction)  - metoprolol succinate (TOPROL-XL) 25 MG 24 hr tablet; Take 1 tablet (25 mg total) by mouth 2 (two) times daily.  Dispense: 30 tablet; Refill: 0

## 2022-05-30 ENCOUNTER — Ambulatory Visit (INDEPENDENT_AMBULATORY_CARE_PROVIDER_SITE_OTHER): Payer: Medicare Other | Admitting: Family Medicine

## 2022-05-30 ENCOUNTER — Encounter: Payer: Self-pay | Admitting: Family Medicine

## 2022-05-30 VITALS — BP 198/108 | HR 97 | Temp 97.9°F | Resp 14 | Ht 67.72 in | Wt 184.5 lb

## 2022-05-30 DIAGNOSIS — I10 Essential (primary) hypertension: Secondary | ICD-10-CM | POA: Diagnosis not present

## 2022-05-30 DIAGNOSIS — I809 Phlebitis and thrombophlebitis of unspecified site: Secondary | ICD-10-CM

## 2022-05-30 DIAGNOSIS — I252 Old myocardial infarction: Secondary | ICD-10-CM

## 2022-05-30 MED ORDER — METOPROLOL SUCCINATE ER 25 MG PO TB24: 25.0000 mg | ORAL_TABLET | Freq: Two times a day (BID) | ORAL | 0 refills | Status: DC

## 2022-05-30 NOTE — Patient Instructions (Signed)
Flebite Phlebitis Flebite  dor e inchao de uma veia. Pode ocorrer nos braos, pernas ou tronco, assim como em reas internas e mais profundas do corpo. A flebite em geral no  sria quando ocorre perto da superfcie do corpo. Mas ela pode causar problemas srios quando ocorre em reas profundas. Quais so as causas? A flebite pode ser causada por: [place], acesso intravenoso (IV) ou de um tubo fino (cateter) na veia. Administrao de determinados medicamentos ou solues por tubo intravenoso (IV). Alguns medicamentos ou solues, como antibiticos e medicamentos para o cncer, podem irritar a veia. Ficar com um acesso IV por um longo perodo ou em uma parte do corpo que se mexe muito. Um cogulo sanguneo. Infeco da veia. Cirurgia em [place]. O que aumenta o risco? Os fatores a Surveyor, quantity suscetvel a desenvolver esse quadro clnico: Sobrepeso ou obesidade. Carrie Mew. Cncer. Reduo ou desacelerao do fluxo de sangue [employer]. Esse quadro clnico pode ser causado por: Repouso no leito por perodo prolongado. Viagem de longa distncia. Leses. Cirurgia. Insuficincia cardaca congestiva. Sedentarismo. Tabagismo. Plulas anticoncepcionais ou terapia de reposio hormonal. Veias varicosas. Doenas inflamatrias ou distrbios sanguneos que aumentam a coagulao. Injetar drogas na veia. Ter um histrico de cogulos sanguneos. Quais so os sinais ou sintomas? Os sintomas desse quadro clnico incluem: Uma rea avermelhada, sensvel, inchada e dolorida na pele. Geralmente, a rea  longa e estreita e pode se espalhar. Sensao de calor ao toque na rea afetada. Carolynn Serve na rea central da rea afetada. Febre baixa. Como esse quadro clnico  diagnosticado? Esse quadro clnico  diagnosticado com base em: Seus sintomas. Um exame fsico. Seu histrico mdico, inclusive seu histrico familiar. Exames podem ser feitos para descartar outros  quadros clnicos, como cogulos sanguneos, especialmente caso voc tenha histrico de cogulos sanguneos ou corra risco maior de sofrer cogulos sanguneos. Isso pode incluir: Exames de sangue. Exame de ultrassom. Exames genticos. Bipsia.  quando uma amostra de tecido  coletada do corpo e examinada em microscpio. Isso  raro. Como esse quadro clnico  tratado? O tratamento depende da gravidade do quadro clnico, assim como Company secretary. Na [employer], o quadro clnico  de importncia menor e melhora rapidamente. O tratamento pode incluir: Aplicao de um pano quente e mido (compressa quente) ou bolsa trmica. Usar meias ou ataduras de compresso. Medicamentos, como: Medicamentos anti-inflamatrios. Antibiticos, se houver uma infeco. Anticoagulantes, em caso de suspeita ou presena de cogulo sanguneo, ou caso voc tenha histrico de cogulos sanguneos ou distrbio Sports coach. Remoo de um [employer] (IV) que possa estar causando o problema. Usar um [employer] ou soluo diferente que no irrite a Research scientist (life sciences). Em casos raros, cirurgia pode ser necessria para remover uma seo danificada de uma veia. Siga estas instrues em casa: Como tratar a dor, a rigidez e o inchao  Se orientado, aplique calor  rea afetada com a frequncia determinada pelo seu mdico. Use a fonte de calor que seu mdico recomendar, como uma compressa quente mida ou uma almofada trmica. Coloque uma toalha entre a pele e a fonte de Freight forwarder. Deixe o calor atuar por 20-30 minutos. Remova a fonte de calor caso a pele fique avermelhada. Isso ser especialmente importante se voc no conseguir sentir dor, calor ou frio. Voc poder ter maior risco de sofrer uma queimadura. Levante (eleve) a rea afetada acima do nvel do corao quando estiver sentado ou deitado. Medicamentos Tome medicamentos vendidos com ou sem receita mdica somente de acordo com as  indicaes do seu  mdico. Caso tenha recebido prescrio de antibitico, tome-o conforme determinado pelo seu mdico. No pare de tomar o antibitico, mesmo se comear a se Warden/ranger. Caso voc esteja tomando um anticoagulante: Converse com seu mdico antes de tomar qualquer medicamento que contenha aspirina ou AINEs (anti-inflamatrios no esteroides), como ibuprofeno. Esses medicamentos aumentam o risco de um sangramento grave. Tome seus medicamentos conforme orientado e no mesmo horrio, todos os dias. Evite atividades que possam causar ferimentos ou leses e siga as orientaes sobre como evitar [place]. Use uma pulseira com suas informaes mdicas ou carregue sempre um carto com os medicamentos que voc toma. Instrues gerais Caso apresente flebite nas pernas: Evite ficar sentado ou deitado por longos perodos. Mantenha as pernas em movimento. Tente fazer caminhadas curtas para no permanecer sentado por longos perodos. Tente evitar perodos de repouso na cama muito longos. O sono normal no conta como repouso na cama. Use meias ou ataduras de compresso de acordo com as orientaes do seu mdico. Essas meias reduzem o inchao nas pernas e ajudam a prevenir cogulos sanguneos. Elas tambm ajudam a evitar que o quadro clnico retorne. No use produtos que contenham nicotina ou tabaco. Esses produtos incluem cigarros tradicionais, fumo de mascar e cigarros eletrnicos. Caso precise de ajuda para parar de fumar, fale com seu mdico. Comparea a todas as consultas de acompanhamento. Isso  importante. Isso pode incluir quaisquer exames de sangue de acompanhamento. Entre em contato com um mdico se: Apresentar problemas incomuns de formao de hematomas ou sangramento. Seus sintomas no melhorarem ou seus sintomas piorarem. Estiver tomando anti-inflamatrio e sentir dor abdominal ou suas fezes ficarem escuras. Busque ajuda imediatamente se: Apresentar repentinamente dor no peito ou problemas para  respirar. Tiver febre e seus sintomas piorarem repentinamente. Tossir sangue. Sentir Biomedical scientist. Apresentar dor e inchao intensos no brao ou na perna afetada. Esses sintomas podem representar um problema srio e ser [employer]. No espere para ver se os sintomas desaparecem. Procure um mdico imediatamente. Ligue para o nmero de Restaurant manager, fast food (911, nos EUA). No dirija por conta prpria at o hospital. Resumo Flebite  dor e inchao de [place]. A flebite em geral no  sria quando ocorre perto da superfcie do corpo. Mas ela pode causar problemas srios quando ocorre em reas profundas. O tratamento depende da gravidade do quadro clnico, assim como Company secretary. Levante (eleve) a rea afetada acima do nvel do corao quando estiver sentado ou deitado. Estas informaes no se destinam a substituir as recomendaes de seu mdico. No deixe de discutir quaisquer dvidas com seu mdico. Document Revised: 05/07/2020 Document Reviewed: 05/07/2020 Elsevier Patient Education  [place].

## 2022-06-06 NOTE — Progress Notes (Unsigned)
Name: Marcus Gray   MRN: [id number]    DOB: 05/28/1956   Date:06/07/2022       Progress Note  Subjective  Chief Complaint  Follow Up  HPI  CAD: patient had acute MI, with chest pain and dizziness, went to hospital in [place] back in 2016. He was admitted and had two stents placed. He moved to [facility] about 2018 and ran out of medications.. He had another episode of chest pain 04/2022 but negative troponin's,  palpitation or SOB, he occasionally has shoulder heaviness but very seldom now, currently no orthopnea . He is now under the care of Dr. Saunders Revel. He is on metoprolol and since bp was very high in October we increased dose to 50 mg and today we will add losartan 25 mg .   FINDINGS   Left Ventricle: Left ventricular ejection fraction, by estimation, is 40  to 45%. The left ventricle has mildly decreased function. The left  ventricle has no regional wall motion abnormalities. The left ventricular  internal cavity size was normal in  size. There is no left ventricular hypertrophy. Left ventricular diastolic  parameters are consistent with Grade I diastolic dysfunction (impaired  relaxation).  MDD: he was diagnosed with depression after his MI in 2016  he was given Citalopram, he had anhedonia, very sad and had to take medications, he took it for about 2 years He does not want to resume medication since he has been doing well without medication   Gout:  he had uric acid checked 06/22 and it was elevated at 7.5 , he states unable to take Allopurinol due to nausea and indigestion No recent episodes of gout . Doing well   Smoker's cough: he smoked 1-2 pack daily for 20 years ( he worked in [place] for [employer] trying out cigarettes for about 4 years) stopped for 34 years and resumed smoking about 1 year after his MI, down to about 4-6  cigarettes daily. He has a smoker's cough, usually wet in the mornings, no wheezing or sob. He is not interested in having lung CT    HTN: bp has been elevated , we will add losartan today 25 mg. BP improved today   RA: he was on  Plaquenil  and saw Dr. Posey Pronto, but he states not having any pain or swelling except for right shoulder , he is willing to see Dr. Posey Pronto again   GERD: seen by GI, taking Pepcid and doing well   B2 and folate deficiency he has been taking folic acid and N23 and we will recheck next visit   Atherosclerosis of aorta: found on CT angio done 04/2022, discussed with patient and advised aspirin, statin and bp control   Thrombocytopenia: discussed results with patient, we will monitor, keep aspirin on board since level above 130   Phlebitis: improving with warm compresses   Patient Active Problem List   Diagnosis Date Noted   Smokers' cough ([place]) 12/08/2021   Presence of stent in coronary artery 12/08/2021   History of MI (myocardial infarction) 12/08/2021   Abnormal red blood cells 55/73/2202   Low folic acid 54/27/0623   B12 deficiency 12/08/2021   Mild episode of recurrent major depressive disorder ([place]) 12/08/2021   Ischemic dilated cardiomyopathy ([place]) 12/08/2021   Long-term use of immunosuppressant medication 01/17/2021   Rheumatoid arthritis of multiple sites with negative rheumatoid factor ([place]) 11/19/2020   Idiopathic chronic gout of multiple sites without tophus 11/17/2020   Gout of multiple sites 10/13/2020  Major depression in remission ([place]) 41/74/0814   Diastolic dysfunction 48/18/5631   Coronary artery disease involving native coronary artery of native heart without angina pectoris 06/17/2020   Hypertension, benign 06/17/2020   Hyperlipidemia LDL goal <70 06/17/2020    Past Surgical History:  Procedure Laterality Date   CARDIAC CATHETERIZATION     COLONOSCOPY WITH PROPOFOL N/A 10/07/2020   Procedure: COLONOSCOPY WITH PROPOFOL;  Surgeon: [name], MD;  Location: [facility] ENDOSCOPY;  Service: Gastroenterology;  Laterality: N/A;   CORONARY STENT PLACEMENT  2016    [place]; OM1 and RCA    Family History  Problem Relation Age of Onset   Diabetes Mother    Emphysema Father    Cerebral aneurysm Daughter     Social History   Tobacco Use   Smoking status: Every Day    Packs/day: 1.00    Types: Cigarettes    Start date: 09/04/1968   Smokeless tobacco: Never  Substance Use Topics   Alcohol use: Yes    Alcohol/week: 5.0 standard drinks of alcohol    Types: 5 Glasses of wine per week    Comment: wine at night     Current Outpatient Medications:    albuterol (VENTOLIN HFA) 108 (90 Base) MCG/ACT inhaler, Inhale 2 puffs into the lungs every 4 (four) hours as needed for shortness of breath., Disp: 8 g, Rfl: 0   aspirin EC 81 MG tablet, Take 1 tablet (81 mg total) by mouth daily., Disp: 30 tablet, Rfl: 0   atorvastatin (LIPITOR) 80 MG tablet, Take 1 tablet (80 mg total) by mouth daily., Disp: 90 tablet, Rfl: 1   cholecalciferol (VITAMIN D3) 25 MCG (1000 UNIT) tablet, Take 1,000 Units by mouth daily., Disp: , Rfl:    colchicine 0.6 MG tablet, Take 2 tablets (1.2 mg total) by mouth daily as needed. And may repeat in 24 hours if no improvement, after that may stop medication and take prn, Disp: 30 tablet, Rfl: 0   Cyanocobalamin (B-12) 500 MCG SUBL, Place 1 tablet under the tongue daily at 12 noon., Disp: 100 tablet, Rfl: 1   famotidine (PEPCID) 40 MG tablet, Take 1 tablet by mouth once daily, Disp: 90 tablet, Rfl: 3   folic acid (FOLVITE) 1 MG tablet, Take 1 tablet (1 mg total) by mouth 2 (two) times daily., Disp: 180 tablet, Rfl: 1   metoprolol succinate (TOPROL-XL) 25 MG 24 hr tablet, Take 1 tablet (25 mg total) by mouth 2 (two) times daily., Disp: 30 tablet, Rfl: 0  No Known Allergies  I personally reviewed active problem list, medication list, allergies, family history, social history, health maintenance with the patient/caregiver today.   ROS  Constitutional: Negative for fever or weight change.  Respiratory: Negative for cough and  shortness of breath.   Cardiovascular: Negative for chest pain or palpitations.  Gastrointestinal: Negative for abdominal pain, no bowel changes.  Musculoskeletal: Negative for gait problem or joint swelling.  Skin: Negative for rash.  Neurological: Negative for dizziness or headache.  No other specific complaints in a complete review of systems (except as listed in HPI above).   Objective  Vitals:   06/07/22 0841  BP: 138/86  Pulse: 72  Resp: 16  SpO2: 98%  Weight: 182 lb (82.6 kg)  Height: '5\' 7"'$  (1.702 m)    Body mass index is 28.51 kg/m.  Physical Exam  Constitutional: Patient appears well-developed and well-nourished.  No distress.  HEENT: head atraumatic, normocephalic, pupils equal and reactive to light, neck supple Cardiovascular: Normal rate,  regular rhythm and normal heart sounds.  No murmur heard. No BLE edema. Pulmonary/Chest: Effort normal and breath sounds normal. No respiratory distress. Abdominal: Soft.  There is no tenderness. Psychiatric: Patient has a normal mood and affect. behavior is normal. Judgment and thought content normal.   Recent Results (from the past 2160 hour(s))  Basic metabolic panel     Status: Abnormal   Collection Time: 05/17/22 12:08 PM  Result Value Ref Range   Sodium 140 135 - 145 mmol/L   Potassium 4.2 3.5 - 5.1 mmol/L   Chloride 110 98 - 111 mmol/L   CO2 22 22 - 32 mmol/L   Glucose, Bld 131 (H) 70 - 99 mg/dL    Comment: Glucose reference range applies only to samples taken after fasting for at least 8 hours.   BUN 7 (L) 8 - 23 mg/dL   Creatinine, Ser 0.96 0.61 - 1.24 mg/dL   Calcium 9.4 8.9 - 10.3 mg/dL   GFR, Estimated >60 >60 mL/min    Comment: (NOTE) Calculated using the CKD-EPI Creatinine Equation (2021)    Anion gap 8 5 - 15    Comment: Performed at [facility], [address] [place], [place] [address]  CBC     Status: Abnormal   Collection Time: 05/17/22 12:08 PM  Result Value Ref Range   WBC 6.8 4.0 -  10.5 K/uL   RBC 5.13 4.22 - 5.81 MIL/uL   Hemoglobin 17.7 (H) 13.0 - 17.0 g/dL   HCT 52.1 (H) 39.0 - 52.0 %   MCV 101.6 (H) 80.0 - 100.0 fL   MCH 34.5 (H) 26.0 - 34.0 pg   MCHC 34.0 30.0 - 36.0 g/dL   RDW 12.8 11.5 - 15.5 %   Platelets 135 (L) 150 - 400 K/uL   nRBC 0.0 0.0 - 0.2 %    Comment: Performed at [facility], [place], [place] [address]  Troponin I (High Sensitivity)     Status: None   Collection Time: 05/17/22 12:08 PM  Result Value Ref Range   Troponin I (High Sensitivity) 8 <18 ng/L    Comment: (NOTE) Elevated high sensitivity troponin I (hsTnI) values and significant  changes across serial measurements may suggest ACS but many other  chronic and acute conditions are known to elevate hsTnI results.  Refer to the "Links" section for chest pain algorithms and additional  guidance. Performed at [facility], [place], [place] [address]   Troponin I (High Sensitivity)     Status: None   Collection Time: 05/17/22  4:18 PM  Result Value Ref Range   Troponin I (High Sensitivity) 9 <18 ng/L    Comment: (NOTE) Elevated high sensitivity troponin I (hsTnI) values and significant  changes across serial measurements may suggest ACS but many other  chronic and acute conditions are known to elevate hsTnI results.  Refer to the "Links" section for chest pain algorithms and additional  guidance. Performed at [facility], [address] [place], [place] [address]   Resp Panel by RT-PCR (Flu A&B, Covid) Anterior Nasal Swab     Status: None   Collection Time: 05/17/22  4:18 PM   Specimen: Anterior Nasal Swab  Result Value Ref Range   SARS Coronavirus 2 by RT PCR NEGATIVE NEGATIVE    Comment: (NOTE) SARS-CoV-2 target nucleic acids are NOT DETECTED.  The SARS-CoV-2 RNA is generally detectable in upper respiratory specimens during the acute phase of infection. The lowest concentration of  SARS-CoV-2 viral copies this  assay can detect is 138 copies/mL. A negative result does not preclude SARS-Cov-2 infection and should not be used as the sole basis for treatment or other patient management decisions. A negative result may occur with  improper specimen collection/handling, submission of specimen other than nasopharyngeal swab, presence of viral mutation(s) within the areas targeted by this assay, and inadequate number of viral copies(<138 copies/mL). A negative result must be combined with clinical observations, patient history, and epidemiological information. The expected result is Negative.  Fact Sheet for Patients:  EntrepreneurPulse.com.au  Fact Sheet for Healthcare Providers:  IncredibleEmployment.be  This test is no t yet approved or cleared by the [place] FDA and  has been authorized for detection and/or diagnosis of SARS-CoV-2 by FDA under an Emergency Use Authorization (EUA). This EUA will remain  in effect (meaning this test can be used) for the duration of the COVID-19 declaration under Section 564(b)(1) of the Act, 21 U.S.C.section 360bbb-3(b)(1), unless the authorization is terminated  or revoked sooner.       Influenza A by PCR NEGATIVE NEGATIVE   Influenza B by PCR NEGATIVE NEGATIVE    Comment: (NOTE) The Xpert Xpress SARS-CoV-2/FLU/RSV plus assay is intended as an aid in the diagnosis of influenza from Nasopharyngeal swab specimens and should not be used as a sole basis for treatment. Nasal washings and aspirates are unacceptable for Xpert Xpress SARS-CoV-2/FLU/RSV testing.  Fact Sheet for Patients: EntrepreneurPulse.com.au  Fact Sheet for Healthcare Providers: IncredibleEmployment.be  This test is not yet approved or cleared by the [place] FDA and has been authorized for detection and/or diagnosis of SARS-CoV-2 by FDA under an Emergency Use Authorization (EUA). This EUA will remain in  effect (meaning this test can be used) for the duration of the COVID-19 declaration under Section 564(b)(1) of the Act, 21 U.S.C. section 360bbb-3(b)(1), unless the authorization is terminated or revoked.  Performed at [facility], [address] [place], [place] [address]   Lipase, blood     Status: None   Collection Time: 05/17/22  4:18 PM  Result Value Ref Range   Lipase 38 11 - 51 U/L    Comment: Performed at [facility], [address] [place], [place] [address]  Hepatic function panel     Status: Abnormal   Collection Time: 05/17/22  4:18 PM  Result Value Ref Range   Total Protein 7.2 6.5 - 8.1 g/dL   Albumin 3.8 3.5 - 5.0 g/dL   AST 27 15 - 41 U/L   ALT 21 0 - 44 U/L   Alkaline Phosphatase 63 38 - 126 U/L   Total Bilirubin 1.0 0.3 - 1.2 mg/dL   Bilirubin, Direct 0.3 (H) 0.0 - 0.2 mg/dL   Indirect Bilirubin 0.7 0.3 - 0.9 mg/dL    Comment: Performed at [facility], [address] [place], [place] [address]  Urinalysis, Routine w reflex microscopic Urine, Clean Catch     Status: Abnormal   Collection Time: 05/17/22  4:40 PM  Result Value Ref Range   Color, Urine YELLOW (A) YELLOW   APPearance CLEAR (A) CLEAR   Specific Gravity, Urine >1.046 (H) 1.005 - 1.030   pH 5.0 5.0 - 8.0   Glucose, UA NEGATIVE NEGATIVE mg/dL   Hgb urine dipstick NEGATIVE NEGATIVE   Bilirubin Urine NEGATIVE NEGATIVE   Ketones, ur 5 (A) NEGATIVE mg/dL   Protein, ur NEGATIVE NEGATIVE mg/dL   Nitrite NEGATIVE NEGATIVE   Leukocytes,Ua NEGATIVE NEGATIVE  Comment: Performed at [facility], [address] [place], [place] [address]    PHQ2/9:    06/07/2022    8:40 AM 05/30/2022    8:38 AM 12/08/2021   10:00 AM 06/09/2021   10:57 AM 05/30/2021    9:42 AM  Depression screen PHQ 2/9  Decreased Interest 0 0 1 0 0  Down, Depressed, Hopeless 0 0 0 0 0  PHQ - 2 Score 0 0 1 0 0  Altered sleeping 0 0 0 0 0  Tired, decreased energy 0 0 1 0 0  Change in  appetite 0 0 1 0 0  Feeling bad or failure about yourself  0 0 0 0 0  Trouble concentrating 0 0 0 0 0  Moving slowly or fidgety/restless 0 0 0 0 0  Suicidal thoughts 0 0 0 0 0  PHQ-9 Score 0 0 3 0 0  Difficult doing work/chores   Somewhat difficult  Not difficult at all    phq 9 is negative   Fall Risk:    06/07/2022    8:40 AM 05/30/2022    8:38 AM 12/08/2021    9:40 AM 06/09/2021   10:57 AM 05/30/2021    9:36 AM  Fall Risk   Falls in the past year? 0 0 0 0 0  Number falls in past yr: 0  0 0 0  Injury with Fall? 0  0 0 0  Risk for fall due to : No Fall Risks  No Fall Risks No Fall Risks   Follow up Falls prevention discussed Falls prevention discussed;Education provided;Falls evaluation completed Falls prevention discussed Falls prevention discussed       Functional Status Survey: Is the patient deaf or have difficulty hearing?: No Does the patient have difficulty seeing, even when wearing glasses/contacts?: No Does the patient have difficulty concentrating, remembering, or making decisions?: No Does the patient have difficulty walking or climbing stairs?: No Does the patient have difficulty dressing or bathing?: No Does the patient have difficulty doing errands alone such as visiting a doctor's office or shopping?: No    Assessment & Plan  1. Ischemic dilated cardiomyopathy (HCC)  Stable   2. MDD in remission ( [place] )  Off medication   3. Atherosclerosis of aorta (HCC)  On statin, aspirin and metoprolol   4. Thrombocytopenia (HCC)  We will monitor   5. B12 deficiency   6. Smokers' cough ([place])   7. Hypertension, benign  - metoprolol succinate (TOPROL-XL) 50 MG 24 hr tablet; Take 1 tablet (50 mg total) by mouth daily.  Dispense: 90 tablet; Refill: 0 - losartan (COZAAR) 25 MG tablet; Take 1 tablet (25 mg total) by mouth daily.  Dispense: 90 tablet; Refill: 0  8. History of MI (myocardial infarction)  - metoprolol succinate (TOPROL-XL) 50 MG 24 hr  tablet; Take 1 tablet (50 mg total) by mouth daily.  Dispense: 90 tablet; Refill: 0 - losartan (COZAAR) 25 MG tablet; Take 1 tablet (25 mg total) by mouth daily.  Dispense: 90 tablet; Refill: 0  9. Need for immunization against influenza  - Flu Vaccine QUAD High Dose(Fluad)  10. Coronary artery disease involving native coronary artery of native heart without angina pectoris   11. Hyperlipidemia LDL goal <70   12. Rheumatoid arthritis of multiple sites with negative rheumatoid factor ([place])  - Ambulatory referral to Rheumatology  13. Phlebitis

## 2022-06-07 ENCOUNTER — Ambulatory Visit: Payer: Medicare Other | Admitting: Family Medicine

## 2022-06-07 ENCOUNTER — Encounter: Payer: Self-pay | Admitting: Family Medicine

## 2022-06-07 VITALS — BP 138/86 | HR 72 | Resp 16 | Ht 67.0 in | Wt 182.0 lb

## 2022-06-07 DIAGNOSIS — D696 Thrombocytopenia, unspecified: Secondary | ICD-10-CM | POA: Diagnosis not present

## 2022-06-07 DIAGNOSIS — E785 Hyperlipidemia, unspecified: Secondary | ICD-10-CM

## 2022-06-07 DIAGNOSIS — I809 Phlebitis and thrombophlebitis of unspecified site: Secondary | ICD-10-CM

## 2022-06-07 DIAGNOSIS — F325 Major depressive disorder, single episode, in full remission: Secondary | ICD-10-CM

## 2022-06-07 DIAGNOSIS — I2510 Atherosclerotic heart disease of native coronary artery without angina pectoris: Secondary | ICD-10-CM

## 2022-06-07 DIAGNOSIS — I255 Ischemic cardiomyopathy: Secondary | ICD-10-CM

## 2022-06-07 DIAGNOSIS — J410 Simple chronic bronchitis: Secondary | ICD-10-CM

## 2022-06-07 DIAGNOSIS — Z23 Encounter for immunization: Secondary | ICD-10-CM

## 2022-06-07 DIAGNOSIS — E538 Deficiency of other specified B group vitamins: Secondary | ICD-10-CM

## 2022-06-07 DIAGNOSIS — R9389 Abnormal findings on diagnostic imaging of other specified body structures: Secondary | ICD-10-CM

## 2022-06-07 DIAGNOSIS — I700 Atherosclerosis of aorta: Secondary | ICD-10-CM | POA: Diagnosis not present

## 2022-06-07 DIAGNOSIS — I10 Essential (primary) hypertension: Secondary | ICD-10-CM

## 2022-06-07 DIAGNOSIS — M0609 Rheumatoid arthritis without rheumatoid factor, multiple sites: Secondary | ICD-10-CM

## 2022-06-07 DIAGNOSIS — I420 Dilated cardiomyopathy: Secondary | ICD-10-CM

## 2022-06-07 DIAGNOSIS — I252 Old myocardial infarction: Secondary | ICD-10-CM

## 2022-06-07 MED ORDER — LOSARTAN POTASSIUM 25 MG PO TABS: 25.0000 mg | ORAL_TABLET | Freq: Every day | ORAL | 0 refills | Status: DC

## 2022-06-07 MED ORDER — METOPROLOL SUCCINATE ER 50 MG PO TB24: 50.0000 mg | ORAL_TABLET | Freq: Every day | ORAL | 0 refills | Status: DC

## 2022-06-07 NOTE — Patient Instructions (Signed)
Pressao arterial tem de ficar enter 115-135 valor de cima

## 2022-08-24 DIAGNOSIS — M1A09X0 Idiopathic chronic gout, multiple sites, without tophus (tophi): Secondary | ICD-10-CM | POA: Diagnosis not present

## 2022-08-24 DIAGNOSIS — M0609 Rheumatoid arthritis without rheumatoid factor, multiple sites: Secondary | ICD-10-CM | POA: Diagnosis not present

## 2022-08-24 DIAGNOSIS — Z7960 Long term (current) use of unspecified immunomodulators and immunosuppressants: Secondary | ICD-10-CM | POA: Diagnosis not present

## 2022-08-24 DIAGNOSIS — R899 Unspecified abnormal finding in specimens from other organs, systems and tissues: Secondary | ICD-10-CM | POA: Diagnosis not present

## 2022-08-24 DIAGNOSIS — R030 Elevated blood-pressure reading, without diagnosis of hypertension: Secondary | ICD-10-CM | POA: Diagnosis not present

## 2022-09-03 ENCOUNTER — Other Ambulatory Visit: Payer: Self-pay | Admitting: Family Medicine

## 2022-09-03 DIAGNOSIS — I252 Old myocardial infarction: Secondary | ICD-10-CM

## 2022-09-03 DIAGNOSIS — I10 Essential (primary) hypertension: Secondary | ICD-10-CM

## 2022-09-06 NOTE — Progress Notes (Unsigned)
Name: Marcus Gray   MRN: [id number]    DOB: [birth year]   Date:09/07/2022       Progress Note  Subjective  Chief Complaint  Follow Up  HPI  CAD/HTN/ischemic dilated cardiomyopathy : patient had acute MI, with chest pain and dizziness, went to hospital in [place] back in 2016. He was admitted and had two stents placed. He moved to [facility] about 2018 and ran out of medications.. He had another episode of chest pain 04/2022 but negative troponin's,  palpitation or SOB, he occasionally has shoulder heaviness but very seldom now, currently no orthopnea . He is now under the care of Dr. Saunders Revel. He is on metoprolol 25 mg  and Losartan, this morning BP is okay but he did not take metoprolol today. We will continue current regiment for now   FINDINGS   Left Ventricle: Left ventricular ejection fraction, by estimation, is 40  to 45%. The left ventricle has mildly decreased function. The left  ventricle has no regional wall motion abnormalities. The left ventricular  internal cavity size was normal in  size. There is no left ventricular hypertrophy. Left ventricular diastolic  parameters are consistent with Grade I diastolic dysfunction (impaired  relaxation).  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 Posey Pronto his liver enzymes were elevated and he has not been drinking over the past few weeks. No tremors or increase in anxiety, he wants to be healthy   Gout:  he had uric acid checked 06/22 and it was elevated at 7.5 ,he is seeing Dr. Posey Pronto and is taking Allopurinol, last level below 4 and no recent gout  attacks.   Smoker's cough: he smoked 1-2 pack daily for 20 years ( he worked in [place] for [employer] trying out cigarettes for about 4 years) stopped for 34 years and resumed smoking about 1 year after his MI, down to about 4-6  cigarettes daily. He has a smoker's cough, usually wet in the mornings, no wheezing or sob. He had CT angiogram that showed abnormalities  ( 2. Clustered ground-glass tree-in-bud nodularity in the right upper lobe, most consistent with an infectious or inflammatory Bronchiolitis ) we will recheck with lung CT for lung cancer screen   HTN: bp is at goal, but states it is usually around 140's, we will adjust dose of losartan to 50 mg daily   RA: he Is back seeing Dr. Posey Pronto, taking plaquenil and tolerating medication well. Reviewed recent labs, states pain on joints is under control   GERD: seen by GI, taking Pepcid and doing well   B2 and folate deficiency he has been taking folic acid and I94 and we will recheck labs today   Atherosclerosis of aorta: found on CT angio done 04/2022, he is taking statins now , states compliant and we will recheck labs today   Thrombocytopenia: stable, repeated by rheumatologist recently     Patient Active Problem List   Diagnosis Date Noted   Atherosclerosis of aorta ([place]) 06/07/2022   Smokers' cough ([place]) 12/08/2021   Presence of stent in coronary artery 12/08/2021  History of MI (myocardial infarction) 12/08/2021   Abnormal red blood cells 02/63/7858   Low folic acid 85/09/7739   B12 deficiency 12/08/2021   Mild episode of recurrent major depressive disorder ([place]) 12/08/2021   Ischemic dilated cardiomyopathy ([place]) 12/08/2021   Long-term use of immunosuppressant medication 01/17/2021   Rheumatoid arthritis of multiple sites with negative rheumatoid factor ([place]) 11/19/2020   Idiopathic chronic gout of multiple sites without tophus 11/17/2020   Gout of multiple sites 10/13/2020   Major depression in remission ([place])  28/78/6767   Diastolic dysfunction 20/94/7096   Coronary artery disease involving native coronary artery of native heart without angina pectoris 06/17/2020   Hypertension, benign 06/17/2020   Hyperlipidemia LDL goal <70 06/17/2020    Past Surgical History:  Procedure Laterality Date   CARDIAC CATHETERIZATION     COLONOSCOPY WITH PROPOFOL N/A 10/07/2020   Procedure: COLONOSCOPY WITH PROPOFOL;  Surgeon: [name], MD;  Location: [facility] ENDOSCOPY;  Service: Gastroenterology;  Laterality: N/A;   CORONARY STENT PLACEMENT  2016   [place]; OM1 and RCA    Family History  Problem Relation Age of Onset   Diabetes Mother    Emphysema Father    Cerebral aneurysm Daughter     Social History   Tobacco Use   Smoking status: Every Day    Packs/day: 1.00    Types: Cigarettes    Start date: 09/04/1968   Smokeless tobacco: Never  Substance Use Topics   Alcohol use: Yes    Alcohol/week: 5.0 standard drinks of alcohol    Types: 5 Glasses of wine per week    Comment: wine at night     Current Outpatient Medications:    albuterol (VENTOLIN HFA) 108 (90 Base) MCG/ACT inhaler, Inhale 2 puffs into the lungs every 4 (four) hours as needed for shortness of breath., Disp: 8 g, Rfl: 0   aspirin EC 81 MG tablet, Take 1 tablet (81 mg total) by mouth daily., Disp: 30 tablet, Rfl: 0   atorvastatin (LIPITOR) 80 MG tablet, Take 1 tablet (80 mg total) by mouth daily., Disp: 90 tablet, Rfl: 1   cholecalciferol (VITAMIN D3) 25 MCG (1000 UNIT) tablet, Take 1,000 Units by mouth daily., Disp: , Rfl:    colchicine 0.6 MG tablet, Take 2 tablets (1.2 mg total) by mouth daily as needed. And may repeat in 24 hours if no improvement, after that may stop medication and take prn, Disp: 30 tablet, Rfl: 0   Cyanocobalamin (B-12) 500 MCG SUBL, Place 1 tablet under the tongue daily at 12 noon., Disp: 100 tablet, Rfl: 1   famotidine (PEPCID) 40 MG tablet, Take 1 tablet by mouth once daily, Disp: 90 tablet, Rfl: 3   folic  acid (FOLVITE) 1 MG tablet, Take 1 tablet (1 mg total) by mouth 2 (two) times daily., Disp: 180 tablet, Rfl: 1   losartan (COZAAR) 25 MG tablet, Take 1 tablet by mouth once daily, Disp: 90 tablet, Rfl: 0   metoprolol succinate (TOPROL-XL) 50 MG 24 hr tablet, Take 1 tablet (50 mg total) by mouth daily., Disp: 90 tablet, Rfl: 0  No Known Allergies  I personally reviewed active problem list, medication list, allergies, family history, social history, health maintenance with the patient/caregiver today.   ROS  Constitutional: Negative for fever or weight change.  Respiratory: Negative for cough and shortness of breath.   Cardiovascular: Negative for chest pain or palpitations.  Gastrointestinal: Negative for abdominal pain, no bowel changes.  Musculoskeletal: Negative for gait problem or joint swelling.  Skin: Negative for rash.  Neurological: Negative for dizziness or headache.  No other specific complaints in a complete review of systems (except as listed in HPI above).   Objective  Vitals:   09/07/22 0848  BP: 132/84  Pulse: 78  Resp: 16  SpO2: 98%  Weight: 181 lb (82.1 kg)  Height: '5\' 7"'$  (1.702 m)    Body mass index is 28.35 kg/m.  Physical Exam  Constitutional: Patient appears well-developed and well-nourished.  No distress.  HEENT: head atraumatic, normocephalic, pupils equal and reactive to light, neck supple Cardiovascular: Normal rate, regular rhythm and normal heart sounds.  No murmur heard. No BLE edema. Pulmonary/Chest: Effort normal and breath sounds normal. No respiratory distress. Abdominal: Soft.  There is no tenderness. Psychiatric: Patient has a normal mood and affect. behavior is normal. Judgment and thought content normal.   PHQ2/9:    09/07/2022    8:48 AM 06/07/2022    8:40 AM 05/30/2022    8:38 AM 12/08/2021   10:00 AM 06/09/2021   10:57 AM  Depression screen PHQ 2/9  Decreased Interest 0 0 0 1 0  Down, Depressed, Hopeless 0 0 0 0 0  PHQ - 2 Score  0 0 0 1 0  Altered sleeping 0 0 0 0 0  Tired, decreased energy 0 0 0 1 0  Change in appetite 0 0 0 1 0  Feeling bad or failure about yourself  0 0 0 0 0  Trouble concentrating 0 0 0 0 0  Moving slowly or fidgety/restless 0 0 0 0 0  Suicidal thoughts 0 0 0 0 0  PHQ-9 Score 0 0 0 3 0  Difficult doing work/chores    Somewhat difficult     phq 9 is negative   Fall Risk:    09/07/2022    8:48 AM 06/07/2022    8:40 AM 05/30/2022    8:38 AM 12/08/2021    9:40 AM 06/09/2021   10:57 AM  Fall Risk   Falls in the past year? 0 0 0 0 0  Number falls in past yr: 0 0  0 0  Injury with Fall? 0 0  0 0  Risk for fall due to : No Fall Risks No Fall Risks  No Fall Risks No Fall Risks  Follow up Falls prevention discussed Falls prevention discussed Falls prevention discussed;Education provided;Falls evaluation completed Falls prevention discussed Falls prevention discussed      Functional Status Survey: Is the patient deaf or have difficulty hearing?: No Does the patient have difficulty seeing, even when wearing glasses/contacts?: No Does the patient have difficulty concentrating, remembering, or making decisions?: No Does the patient have difficulty walking or climbing stairs?: No Does the patient have difficulty dressing or bathing?: No Does the patient have difficulty doing errands alone such as visiting a doctor's office or shopping?: No    Assessment & Plan  1. Atherosclerosis of aorta (HCC)  Taking atorvastatin  - Lipid panel  2. Major depression in remission [facility])  Doing well at this time, not on medication   3. Ischemic dilated cardiomyopathy [facility])  Seeing cardiologist   4. Smokers' cough ([place])  Mild and does not want medication  Referral lung cancer screen   5. Rheumatoid arthritis of multiple sites with negative rheumatoid factor (HCC)  Seeing Dr. Posey Pronto   6. Thrombocytopenia ([place])  Reviewed last labs   7. Coronary artery disease involving native coronary artery  of native heart without angina pectoris  - atorvastatin (LIPITOR) 80  MG tablet; Take 1 tablet (80 mg total) by mouth daily.  Dispense: 90 tablet; Refill: 1  8. B12 deficiency  - B12 and Folate Panel  9. History of MI (myocardial infarction)  - losartan (COZAAR) 50 MG tablet; Take 1 tablet (50 mg total) by mouth daily.  Dispense: 90 tablet; Refill: 1 - metoprolol succinate (TOPROL-XL) 25 MG 24 hr tablet; Take 1 tablet (25 mg total) by mouth daily.  Dispense: 90 tablet; Refill: 1 - atorvastatin (LIPITOR) 80 MG tablet; Take 1 tablet (80 mg total) by mouth daily.  Dispense: 90 tablet; Refill: 1  10. Hypertension, benign  - losartan (COZAAR) 50 MG tablet; Take 1 tablet (50 mg total) by mouth daily.  Dispense: 90 tablet; Refill: 1 - metoprolol succinate (TOPROL-XL) 25 MG 24 hr tablet; Take 1 tablet (25 mg total) by mouth daily.  Dispense: 90 tablet; Refill: 1  11. Controlled gout  Doing well, last uric acid at goal   12. Erectile dysfunction due to diseases classified elsewhere  - sildenafil (VIAGRA) 100 MG tablet; Take 0.5-1 tablets (50-100 mg total) by mouth daily as needed for erectile dysfunction.  Dispense: 30 tablet; Refill: 1  13. Vitamin D deficiency  - VITAMIN D 25 Hydroxy (Vit-D Deficiency, Fractures)  14. Elevated liver function tests  He stopped drinking recently   15. Alcoholism in remission ([place])

## 2022-09-07 ENCOUNTER — Ambulatory Visit (INDEPENDENT_AMBULATORY_CARE_PROVIDER_SITE_OTHER): Payer: Medicare Other | Admitting: Family Medicine

## 2022-09-07 ENCOUNTER — Encounter: Payer: Self-pay | Admitting: Family Medicine

## 2022-09-07 VITALS — BP 132/84 | HR 78 | Resp 16 | Ht 67.0 in | Wt 181.0 lb

## 2022-09-07 DIAGNOSIS — I2510 Atherosclerotic heart disease of native coronary artery without angina pectoris: Secondary | ICD-10-CM

## 2022-09-07 DIAGNOSIS — M0609 Rheumatoid arthritis without rheumatoid factor, multiple sites: Secondary | ICD-10-CM | POA: Diagnosis not present

## 2022-09-07 DIAGNOSIS — Z87891 Personal history of nicotine dependence: Secondary | ICD-10-CM

## 2022-09-07 DIAGNOSIS — F325 Major depressive disorder, single episode, in full remission: Secondary | ICD-10-CM | POA: Diagnosis not present

## 2022-09-07 DIAGNOSIS — I700 Atherosclerosis of aorta: Secondary | ICD-10-CM

## 2022-09-07 DIAGNOSIS — D696 Thrombocytopenia, unspecified: Secondary | ICD-10-CM

## 2022-09-07 DIAGNOSIS — I255 Ischemic cardiomyopathy: Secondary | ICD-10-CM

## 2022-09-07 DIAGNOSIS — E559 Vitamin D deficiency, unspecified: Secondary | ICD-10-CM

## 2022-09-07 DIAGNOSIS — I10 Essential (primary) hypertension: Secondary | ICD-10-CM | POA: Diagnosis not present

## 2022-09-07 DIAGNOSIS — J410 Simple chronic bronchitis: Secondary | ICD-10-CM

## 2022-09-07 DIAGNOSIS — I252 Old myocardial infarction: Secondary | ICD-10-CM

## 2022-09-07 DIAGNOSIS — E538 Deficiency of other specified B group vitamins: Secondary | ICD-10-CM

## 2022-09-07 DIAGNOSIS — M109 Gout, unspecified: Secondary | ICD-10-CM

## 2022-09-07 DIAGNOSIS — I420 Dilated cardiomyopathy: Secondary | ICD-10-CM

## 2022-09-07 DIAGNOSIS — F1021 Alcohol dependence, in remission: Secondary | ICD-10-CM

## 2022-09-07 DIAGNOSIS — N521 Erectile dysfunction due to diseases classified elsewhere: Secondary | ICD-10-CM

## 2022-09-07 DIAGNOSIS — R7989 Other specified abnormal findings of blood chemistry: Secondary | ICD-10-CM

## 2022-09-07 MED ORDER — METOPROLOL SUCCINATE ER 25 MG PO TB24: 25.0000 mg | ORAL_TABLET | Freq: Every day | ORAL | 1 refills | Status: DC

## 2022-09-07 MED ORDER — SILDENAFIL CITRATE 100 MG PO TABS: 50.0000 mg | ORAL_TABLET | Freq: Every day | ORAL | 1 refills | Status: DC | PRN

## 2022-09-07 MED ORDER — LOSARTAN POTASSIUM 50 MG PO TABS: 50.0000 mg | ORAL_TABLET | Freq: Every day | ORAL | 1 refills | Status: DC

## 2022-09-07 MED ORDER — ATORVASTATIN CALCIUM 80 MG PO TABS: 80.0000 mg | ORAL_TABLET | Freq: Every day | ORAL | 1 refills | Status: DC

## 2022-09-08 LAB — B12 AND FOLATE PANEL
Folate: 7.9 ng/mL
Vitamin B-12: 1367 pg/mL — ABNORMAL HIGH (ref 200–1100)

## 2022-09-08 LAB — LIPID PANEL
Cholesterol: 110 mg/dL (ref <200)
HDL: 41 mg/dL (ref >=40)
LDL Cholesterol (Calc): 49 mg/dL (calc)
Non-HDL Cholesterol (Calc): 69 mg/dL (calc) (ref <130)
Total CHOL/HDL Ratio: 2.7 (calc) (ref <5.0)
Triglycerides: 113 mg/dL (ref <150)

## 2022-09-08 LAB — VITAMIN D 25 HYDROXY (VIT D DEFICIENCY, FRACTURES): Vit D, 25-Hydroxy: 21 ng/mL — ABNORMAL LOW (ref 30–100)

## 2022-12-06 NOTE — Progress Notes (Unsigned)
Name: Marcus Gray   MRN: [id number]    DOB: 1956/04/21   Date:12/07/2022       Progress Note  Subjective  Chief Complaint  Follow Up  HPI  CAD/HTN/ischemic dilated cardiomyopathy : patient had acute MI, with chest pain and dizziness, went to hospital in [place] back in 2016. He was admitted and had two stents placed. He moved to [facility] about 2018 and ran out of medications.. He had another episode of chest pain 04/2022 but negative troponin's,  palpitation or SOB, he occasionally has shoulder heaviness but very seldom now, currently no orthopnea . He needs to go back to see Dr. Okey Dupre. He is on metoprolol , losartan and statin therapy   FINDINGS   Left Ventricle: Left ventricular ejection fraction, by estimation, is 40  to 45%. The left ventricle has mildly decreased function. The left  ventricle has no regional wall motion abnormalities. The left ventricular  internal cavity size was normal in  size. There is no left ventricular hypertrophy. Left ventricular diastolic  parameters are consistent with Grade I diastolic dysfunction (impaired  relaxation).  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 Allena Katz and is taking Allopurinol, last level below 4 and no flares for a while   Smoker's cough: he smoked 1-2 pack daily for 20 years ( he worked in [place] for [employer] trying out cigarettes for about 4 years) stopped for 34 years and resumed smoking about 1 year after his MI, down to about  4-5   cigarettes daily. He has a smoker's cough, usually wet in the mornings, no wheezing or sob. He had CT angiogram that showed abnormalities  ( 2. Clustered ground-glass tree-in-bud nodularity in the right upper lobe, most consistent with an infectious or inflammatory Bronchiolitis ) we will give him the phone number so he can schedule a CT lung cancer screen   HTN: bp is at goal, taking medications daily now, losartan and metoprolol and no side effects. No chest pain or palpitation   RA: he Is back seeing Dr. Allena Katz, taking plaquenil and tolerating medication well. Denies joint aches or effusion   GERD: seen by GI, taking Pepcid and doing well . Unchanged   B12 and folate deficiency he has been taking folic acid and B12 , reviewed labs   Atherosclerosis of aorta: found on CT angio done 04/2022, he has been compliant with statin therapy , last LDL was 49   Thrombocytopenia: stable, repeated by rheumatologist   Patient Active Problem List   Diagnosis Date Noted   Atherosclerosis of aorta (HCC) 06/07/2022   Smokers' cough (HCC) 12/08/2021   Presence of stent in coronary artery 12/08/2021   History of MI (myocardial infarction) 12/08/2021   Abnormal red blood cells 12/08/2021   Low folic acid 12/08/2021   B12 deficiency 12/08/2021   Mild episode of recurrent major depressive disorder (HCC) 12/08/2021   Ischemic dilated cardiomyopathy (HCC) 12/08/2021  Long-term use of immunosuppressant medication 01/17/2021   Rheumatoid arthritis of multiple sites with negative rheumatoid factor (HCC) 11/19/2020   Idiopathic chronic gout of multiple sites without tophus 11/17/2020   Gout of multiple sites 10/13/2020   Major depression in remission (HCC) 10/13/2020   Diastolic dysfunction 10/13/2020   Coronary artery disease involving native coronary artery of native heart without angina pectoris 06/17/2020   Hypertension, benign 06/17/2020   Hyperlipidemia LDL goal <70 06/17/2020    Past Surgical  History:  Procedure Laterality Date   CARDIAC CATHETERIZATION     COLONOSCOPY WITH PROPOFOL N/A 10/07/2020   Procedure: COLONOSCOPY WITH PROPOFOL;  Surgeon: Wyline Mood, MD;  Location[facility] ENDOSCOPY;  Service: Gastroenterology;  Laterality: N/A;   CORONARY STENT PLACEMENT  2016   [place]; OM1 and RCA    Family History  Problem Relation Age of Onset   Diabetes Mother    Emphysema Father    Cerebral aneurysm Daughter     Social History   Tobacco Use   Smoking status: Every Day    Packs/day: 1    Types: Cigarettes    Start date: 09/04/1968   Smokeless tobacco: Never  Substance Use Topics   Alcohol use: Yes    Alcohol/week: 5.0 standard drinks of alcohol    Types: 5 Glasses of wine per week    Comment: wine at night     Current Outpatient Medications:    albuterol (VENTOLIN HFA) 108 (90 Base) MCG/ACT inhaler, Inhale 2 puffs into the lungs every 4 (four) hours as needed for shortness of breath., Disp: 8 g, Rfl: 0   allopurinol (ZYLOPRIM) 100 MG tablet, Take 150 mg by mouth daily., Disp: , Rfl:    aspirin EC 81 MG tablet, Take 1 tablet (81 mg total) by mouth daily., Disp: 30 tablet, Rfl: 0   atorvastatin (LIPITOR) 80 MG tablet, Take 1 tablet (80 mg total) by mouth daily., Disp: 90 tablet, Rfl: 1   cholecalciferol (VITAMIN D3) 25 MCG (1000 UNIT) tablet, Take 1,000 Units by mouth daily., Disp: , Rfl:    colchicine 0.6 MG tablet, Take 2 tablets (1.2 mg total) by mouth daily as needed. And may repeat in 24 hours if no improvement, after that may stop medication and take prn, Disp: 30 tablet, Rfl: 0   Cyanocobalamin (B-12) 500 MCG SUBL, Place 1 tablet under the tongue daily at 12 noon., Disp: 100 tablet, Rfl: 1   famotidine (PEPCID) 40 MG tablet, Take 1 tablet by mouth once daily, Disp: 90 tablet, Rfl: 3   folic acid (FOLVITE) 1 MG tablet, Take 1 tablet (1 mg total) by mouth 2 (two) times daily., Disp: 180 tablet, Rfl: 1   hydroxychloroquine (PLAQUENIL) 200 MG tablet, Take 200 mg  by mouth 2 (two) times daily., Disp: , Rfl:    losartan (COZAAR) 50 MG tablet, Take 1 tablet (50 mg total) by mouth daily., Disp: 90 tablet, Rfl: 1   metoprolol succinate (TOPROL-XL) 25 MG 24 hr tablet, Take 1 tablet (25 mg total) by mouth daily., Disp: 90 tablet, Rfl: 1   sildenafil (VIAGRA) 100 MG tablet, Take 0.5-1 tablets (50-100 mg total) by mouth daily as needed for erectile dysfunction., Disp: 30 tablet, Rfl: 1  No Known Allergies  I personally reviewed active problem list, medication list, allergies, family history, social history, health maintenance with the patient/caregiver today.   ROS  Constitutional: Negative for fever or weight change.  Respiratory: Negative for cough and shortness of breath.   Cardiovascular: Negative for chest pain  or palpitations.  Gastrointestinal: Negative for abdominal pain, no bowel changes.  Musculoskeletal: Negative for gait problem or joint swelling.  Skin: Negative for rash.  Neurological: Negative for dizziness or headache.  No other specific complaints in a complete review of systems (except as listed in HPI above).   Objective  Vitals:   12/07/22 0909  BP: 126/82  Pulse: 63  Resp: 16  Temp: 98.2 F (36.8 C)  TempSrc: Oral  SpO2: 98%  Weight: 181 lb 14.4 oz (82.5 kg)  Height: 5\' 8"  (1.727 m)    Body mass index is 27.66 kg/m.  Physical Exam  Constitutional: Patient appears well-developed and well-nourished.  No distress.  HEENT: head atraumatic, normocephalic, pupils equal and reactive to light,, neck supple Cardiovascular: Normal rate, regular rhythm and normal heart sounds.  No murmur heard. No BLE edema. Pulmonary/Chest: Effort normal and breath sounds normal. No respiratory distress. Abdominal: Soft.  There is no tenderness. Psychiatric: Patient has a normal mood and affect. behavior is normal. Judgment and thought content normal.   PHQ2/9:    12/07/2022    9:09 AM 09/07/2022    8:48 AM 06/07/2022    8:40 AM 05/30/2022     8:38 AM 12/08/2021   10:00 AM  Depression screen PHQ 2/9  Decreased Interest 0 0 0 0 1  Down, Depressed, Hopeless 0 0 0 0 0  PHQ - 2 Score 0 0 0 0 1  Altered sleeping 0 0 0 0 0  Tired, decreased energy 0 0 0 0 1  Change in appetite 0 0 0 0 1  Feeling bad or failure about yourself  0 0 0 0 0  Trouble concentrating 0 0 0 0 0  Moving slowly or fidgety/restless 0 0 0 0 0  Suicidal thoughts 0 0 0 0 0  PHQ-9 Score 0 0 0 0 3  Difficult doing work/chores     Somewhat difficult    phq 9 is negative   Fall Risk:    12/07/2022    9:09 AM 09/07/2022    8:48 AM 06/07/2022    8:40 AM 05/30/2022    8:38 AM 12/08/2021    9:40 AM  Fall Risk   Falls in the past year? 0 0 0 0 0  Number falls in past yr:  0 0  0  Injury with Fall?  0 0  0  Risk for fall due to : No Fall Risks No Fall Risks No Fall Risks  No Fall Risks  Follow up Falls prevention discussed Falls prevention discussed Falls prevention discussed Falls prevention discussed;Education provided;Falls evaluation completed Falls prevention discussed      Functional Status Survey: Is the patient deaf or have difficulty hearing?: No Does the patient have difficulty seeing, even when wearing glasses/contacts?: No Does the patient have difficulty concentrating, remembering, or making decisions?: No Does the patient have difficulty walking or climbing stairs?: No Does the patient have difficulty dressing or bathing?: No Does the patient have difficulty doing errands alone such as visiting a doctor's office or shopping?: No    Assessment & Plan  1. Atherosclerosis of aorta (HCC)  On statint herapy   2. Ischemic dilated cardiomyopathy (HCC)  - Ambulatory referral to Cardiology  3. Rheumatoid arthritis of multiple sites with negative rheumatoid factor (HCC)  Doing well since resumed medications and seeing Dr. Allena Katz   4. Smokers' cough (HCC)  Doing much better, due for CT lung screen   5. Alcoholism in remission [facility])  Drinks  occasionally  on weekends   6. Major depression in remission [facility])  Doing well and off medication  7. Coronary artery disease involving native coronary artery of native heart without angina pectoris  - Ambulatory referral to Cardiology  8. History of MI (myocardial infarction)  - Ambulatory referral to Cardiology

## 2022-12-07 ENCOUNTER — Encounter: Payer: Self-pay | Admitting: Family Medicine

## 2022-12-07 ENCOUNTER — Ambulatory Visit (INDEPENDENT_AMBULATORY_CARE_PROVIDER_SITE_OTHER): Payer: Medicare Other | Admitting: Family Medicine

## 2022-12-07 VITALS — BP 126/82 | HR 63 | Temp 98.2°F | Resp 16 | Ht 68.0 in | Wt 181.9 lb

## 2022-12-07 DIAGNOSIS — I255 Ischemic cardiomyopathy: Secondary | ICD-10-CM | POA: Diagnosis not present

## 2022-12-07 DIAGNOSIS — I700 Atherosclerosis of aorta: Secondary | ICD-10-CM

## 2022-12-07 DIAGNOSIS — M0609 Rheumatoid arthritis without rheumatoid factor, multiple sites: Secondary | ICD-10-CM | POA: Diagnosis not present

## 2022-12-07 DIAGNOSIS — I420 Dilated cardiomyopathy: Secondary | ICD-10-CM | POA: Diagnosis not present

## 2022-12-07 DIAGNOSIS — F325 Major depressive disorder, single episode, in full remission: Secondary | ICD-10-CM

## 2022-12-07 DIAGNOSIS — I252 Old myocardial infarction: Secondary | ICD-10-CM | POA: Diagnosis not present

## 2022-12-07 DIAGNOSIS — I2510 Atherosclerotic heart disease of native coronary artery without angina pectoris: Secondary | ICD-10-CM

## 2022-12-07 DIAGNOSIS — J410 Simple chronic bronchitis: Secondary | ICD-10-CM | POA: Diagnosis not present

## 2022-12-07 DIAGNOSIS — F1021 Alcohol dependence, in remission: Secondary | ICD-10-CM

## 2022-12-07 NOTE — Patient Instructions (Signed)
Lung Cancer Screen Scheduling: [phone]

## 2022-12-26 DIAGNOSIS — Z7960 Long term (current) use of unspecified immunomodulators and immunosuppressants: Secondary | ICD-10-CM | POA: Diagnosis not present

## 2022-12-26 DIAGNOSIS — R7989 Other specified abnormal findings of blood chemistry: Secondary | ICD-10-CM | POA: Diagnosis not present

## 2022-12-26 DIAGNOSIS — M0609 Rheumatoid arthritis without rheumatoid factor, multiple sites: Secondary | ICD-10-CM | POA: Diagnosis not present

## 2022-12-26 DIAGNOSIS — M1A09X0 Idiopathic chronic gout, multiple sites, without tophus (tophi): Secondary | ICD-10-CM | POA: Diagnosis not present

## 2022-12-27 ENCOUNTER — Encounter: Payer: Self-pay | Admitting: Cardiology

## 2022-12-27 ENCOUNTER — Ambulatory Visit: Payer: Medicare Other | Attending: Cardiology | Admitting: Cardiology

## 2022-12-27 VITALS — BP 164/114 | Ht 67.32 in | Wt 181.2 lb

## 2022-12-27 DIAGNOSIS — I2510 Atherosclerotic heart disease of native coronary artery without angina pectoris: Secondary | ICD-10-CM | POA: Diagnosis not present

## 2022-12-27 DIAGNOSIS — I255 Ischemic cardiomyopathy: Secondary | ICD-10-CM | POA: Diagnosis not present

## 2022-12-27 DIAGNOSIS — I10 Essential (primary) hypertension: Secondary | ICD-10-CM

## 2022-12-27 DIAGNOSIS — F1021 Alcohol dependence, in remission: Secondary | ICD-10-CM

## 2022-12-27 DIAGNOSIS — E785 Hyperlipidemia, unspecified: Secondary | ICD-10-CM

## 2022-12-27 DIAGNOSIS — I420 Dilated cardiomyopathy: Secondary | ICD-10-CM | POA: Diagnosis not present

## 2022-12-27 DIAGNOSIS — I252 Old myocardial infarction: Secondary | ICD-10-CM | POA: Diagnosis not present

## 2022-12-27 DIAGNOSIS — M0609 Rheumatoid arthritis without rheumatoid factor, multiple sites: Secondary | ICD-10-CM | POA: Diagnosis not present

## 2022-12-27 MED ORDER — LOSARTAN POTASSIUM 100 MG PO TABS
100.0000 mg | ORAL_TABLET | Freq: Every day | ORAL | 3 refills | Status: DC
Start: 2022-12-27 — End: 2024-04-09

## 2022-12-27 NOTE — Progress Notes (Signed)
Cardiology Office Note:   Date:  12/27/2022  ID:  [facility] [place], [place] Dec 19, 1955, MRN [id number]  History of Present Illness:   Marcus Gray is a 67 y.o. male with past medical history of coronary artery disease status post DES to the RCA and OM1 in 2015, ischemic cardiomyopathy (EF 45%, 2015), history of alcohol abuse, thrombocytopenia, hypertension, hyperlipidemia, arthritis, who presents today for follow-up of hypertension.  He was admitted to [facility] in 2015.  He was referred to the ED at that time for abnormal liver test concern for Tylenol toxicity and empirically treated with N acetylcysteine.  Echocardiogram showed mild concentric LVH, LVEF 45%, inferior wall severe hypokinesis, basal posterior lateral wall hypokinesis, mild LA enlargement, moderate MR without stenosis.  Due to wall motion abnormalities he was evaluated by cardiology and underwent balloon angioplasty and DES to RCA which was 100% occluded and OM1.  Reported an MI in 2016 with PCI to OM1 and mid RCA at that time.  He was seen in the [facility] emergency department June 2021 complaining of chest pain radiating to his back.  CTA of the chest and high-sensitivity troponin unrevealing leading to the diagnosis of bronchitis.  Echocardiogram in January 2022 revealed LVEF of 40 to 45%, hypokinesis of the inferior wall, grade 1 diastolic dysfunction, and mild MR.  He was last seen in clinic 10/22/2020.  At that time he had no complaints of chest pain or shortness of breath.  He was restarted on lisinopril 40 mg daily added to the medication for approximately 1 month.  He was continued on his other medications.  Follow-up in 4 months.  Unfortunately he was lost to follow-up and has not been seen in clinic since that time.  He returns to clinic today with the use of mobile interpreter [phone] throughout the majority of the visit.  Unfortunately there was a connection issue and the interpreter was in and out due to a  signal issue.  Patient return to clinic today and states that he was doing well.  He had noted a slight increase in his blood pressure.  Has not had any emergency department visits or surgeries since he was last seen.  He recently followed up with his PCP and had blood work done that was all within normal limits.  He denied any chest pain, shortness of breath, peripheral edema.  Stated that his blood pressures were running 130s to 150s at home when he takes his blood pressure.  ROS: 10 point review of systems has been completed and considered negative with exception of what is been listed in the HPI  Studies Reviewed:    EKG: Sinus rhythm with a rate of 63, unifocal PVCs, left axis deviation, no acute change from prior study noted  TTE 08/27/20 1. Left ventricular ejection fraction, by estimation, is 40 to 45%. The  left ventricle has mildly decreased function. Hypokinesis of the infeiror  wall. Left ventricular diastolic parameters are consistent with Grade I  diastolic dysfunction (impaired  relaxation).   2. Right ventricular systolic function is normal. The right ventricular  size is normal.   3. The mitral valve is normal in structure. Mild mitral valve  regurgitation. No evidence of mitral stenosis.   Risk Assessment/Calculations:     HYPERTENSION CONTROL Vitals:   12/27/22 0911 12/27/22 0927  BP: (!) 177/107 (!) 164/114    [name] blood pressure is elevated above target today.  In order to address the patient's elevated BP: A current anti-hypertensive medication  was adjusted today.           Physical Exam:   VS:  BP (!) 164/114 (BP Location: Right Arm, Patient Position: Sitting, Cuff Size: Normal)   Ht 5' 7.32" (1.71 m)   Wt 181 lb 3.2 oz (82.2 kg)   SpO2 97%   BMI 28.11 kg/m    Wt Readings from Last 3 Encounters:  12/27/22 181 lb 3.2 oz (82.2 kg)  12/07/22 181 lb 14.4 oz (82.5 kg)  09/07/22 181 lb (82.1 kg)     GEN: Well nourished, well developed in no  acute distress NECK: No JVD; No carotid bruits CARDIAC: RRR, no murmurs, rubs, gallops RESPIRATORY:  Clear to auscultation without rales, wheezing or rhonchi  ABDOMEN: Soft, non-tender, non-distended EXTREMITIES:  No edema; No deformity   ASSESSMENT AND PLAN:   Coronary artery disease/ischemic cardiomyopathy status post DES to RCA and OM1 in 2016.  Echo at that time with an LVEF of 45%.  He denies any shortness of breath, peripheral edema, or weight gain.  NYHA I.  Euvolemic on exam.  Is continued on aspirin and atorvastatin.  Is also continued on losartan and Toprol-XL.  Will consider repeating echocardiogram on return.  Hypertension with blood pressure 177/27 and 160/114, patient states he runs 130s to 150s at home after he takes his medications.  With elevation in his blood pressure and I will blood pressure being elevated will increase his losartan to 100 mg daily.  He just recently had a blood work done with his PCP which showed stable kidney function and electrolytes. Will repeat on return.  Hyperlipidemia with an LDL of 49 09/07/2022.  He has been continued on atorvastatin 80 mg daily without incident.  He has been at goal of 70 or less.  Rheumatoid arthritis where he is continued on Plaquenil he is followed by Dr. Allena Katz.  Alcoholism with early remission.  Patient had drank approximately 3 glasses of wine every night for many years and now he is drinking occasionally on weekends.  Cessation is recommended to prevent an alcohol induced cardiomyopathy.  Disposition patient return to clinic to see MD/APP in 3 months or sooner if needed.  At that time we will reevaluate blood pressure and any other symptoms he may have been suffering from.  History was difficult to obtain today due to issues with mobile interpreter.        Signed, Amiri Riechers, NP

## 2022-12-27 NOTE — Patient Instructions (Signed)
Medication Instructions:  Your physician has recommended you make the following change in your medication:   losartan (COZAAR) 100 MG tablet - Take 1 tablet (100 mg total) by mouth daily.  *If you need a refill on your cardiac medications before your next appointment, please call your pharmacy*  Lab Work: -None If you have labs (blood work) drawn today and your tests are completely normal, you will receive your results only by: MyChart Message (if you have MyChart) OR A paper copy in the mail If you have any lab test that is abnormal or we need to change your treatment, we will call you to review the results.  Testing/Procedures: -None  Follow-Up: At [facility], you and your health needs are our priority.  As part of our continuing mission to provide you with exceptional heart care, we have created designated Provider Care Teams.  These Care Teams include your primary Cardiologist (physician) and Advanced Practice Providers (APPs -  Physician Assistants and Nurse Practitioners) who all work together to provide you with the care you need, when you need it.  We recommend signing up for the patient portal called "MyChart".  Sign up information is provided on this After Visit Summary.  MyChart is used to connect with patients for Virtual Visits (Telemedicine).  Patients are able to view lab/test results, encounter notes, upcoming appointments, etc.  Non-urgent messages can be sent to your provider as well.   To learn more about what you can do with MyChart, go to [email or web address].    Your next appointment:   3 month(s)  Provider:   You may see Yvonne Kendall, MD or one of the following Advanced Practice Providers on your designated Care Team:   Nicolasa Ducking, NP Eula Listen, PA-C Cadence Fransico Michael, PA-C Charlsie Quest, NP    Other Instructions -None

## 2023-01-12 ENCOUNTER — Telehealth: Payer: Self-pay

## 2023-01-12 NOTE — Telephone Encounter (Signed)
Spoke with patient by phone using step daughter to interpret.  Patient screened for pack year history. Must meet 20 pack years for LDCT. Patient began smoking age 67 yo.  Quit for 22 years.  States he only smoked 10 cigarettes per day.  Verified he never smoked more than 10 cigarettes. Patient states firmly just half pack a day - no more.  He would not meet 20 pack year history.  Smoked 28 years at half pack to equal 14 pack years.   He is very interested in having a chest CT but doesn't qualify for LDCT due to does not meet pack years criteria of 20.  Will route message to referring provider (Dr. Carlynn Purl) for possible CT chest wo or other recommendation. Patient would like office to call to discuss other options such as alternative scan.

## 2023-04-04 ENCOUNTER — Encounter: Payer: Self-pay | Admitting: Cardiology

## 2023-04-04 ENCOUNTER — Ambulatory Visit: Payer: Medicare Other | Attending: Cardiology | Admitting: Cardiology

## 2023-04-04 VITALS — BP 158/80 | HR 73 | Ht 67.72 in | Wt 182.2 lb

## 2023-04-04 DIAGNOSIS — I119 Hypertensive heart disease without heart failure: Secondary | ICD-10-CM | POA: Diagnosis not present

## 2023-04-04 DIAGNOSIS — E785 Hyperlipidemia, unspecified: Secondary | ICD-10-CM

## 2023-04-04 DIAGNOSIS — M0609 Rheumatoid arthritis without rheumatoid factor, multiple sites: Secondary | ICD-10-CM

## 2023-04-04 DIAGNOSIS — I10 Essential (primary) hypertension: Secondary | ICD-10-CM

## 2023-04-04 DIAGNOSIS — I43 Cardiomyopathy in diseases classified elsewhere: Secondary | ICD-10-CM | POA: Diagnosis not present

## 2023-04-04 DIAGNOSIS — I2510 Atherosclerotic heart disease of native coronary artery without angina pectoris: Secondary | ICD-10-CM

## 2023-04-04 DIAGNOSIS — F1021 Alcohol dependence, in remission: Secondary | ICD-10-CM

## 2023-04-04 MED ORDER — AMLODIPINE BESYLATE 5 MG PO TABS: 5.0000 mg | ORAL_TABLET | Freq: Every day | ORAL | 3 refills | Status: DC

## 2023-04-04 NOTE — Patient Instructions (Signed)
Medication Instructions:   Start Taking: Amlodipine (Norvasc) 5 mg once daily  *If you need a refill on your cardiac medications before your next appointment, please call your pharmacy*   Lab Work: none If you have labs (blood work) drawn today and your tests are completely normal, you will receive your results only by: MyChart Message (if you have MyChart) OR A paper copy in the mail If you have any lab test that is abnormal or we need to change your treatment, we will call you to review the results.   Testing/Procedures: Your physician has requested that you have an echocardiogram. Echocardiography is a painless test that uses sound waves to create images of your heart. It provides your doctor with information about the size and shape of your heart and how well your heart's chambers and valves are working.   You may receive an ultrasound enhancing agent through an IV if needed to better visualize your heart during the echo. This procedure takes approximately one hour.  There are no restrictions for this procedure.  This will take place at [address] [address], [place] [address]    Follow-Up: At [facility], you and your health needs are our priority.  As part of our continuing mission to provide you with exceptional heart care, we have created designated Provider Care Teams.  These Care Teams include your primary Cardiologist (physician) and Advanced Practice Providers (APPs -  Physician Assistants and Nurse Practitioners) who all work together to provide you with the care you need, when you need it.  We recommend signing up for the patient portal called "MyChart".  Sign up information is provided on this After Visit Summary.  MyChart is used to connect with patients for Virtual Visits (Telemedicine).  Patients are able to view lab/test results, encounter notes, upcoming appointments, etc.  Non-urgent messages can be sent to your provider as well.    To learn more about what you can do with MyChart, go to [email or web address].    Your next appointment:   1 month after echo  Provider:   Charlsie Quest, NP

## 2023-04-04 NOTE — Progress Notes (Signed)
Cardiology Office Note:  .   Date:  04/04/2023  ID:  [address] [place], [place] February 21, 1956, MRN [id number] PCP: Alba Cory, MD  [facility] Providers Cardiologist:  Yvonne Kendall, MD    History of Present Illness: .   Marcus Gray is a 67 y.o. male with a past medical history of coronary artery disease status post DES to the RCA and OM1 in 2015, ischemic cardiomyopathy (EF 45%, 2015), history of alcohol abuse, thrombocytopenia, hypertension, hyperlipidemia, arthritis, who presents today for follow-up.  He is previously admitted to the hospital in 2015 after he was referred to the ED for abnormal liver test with concern for Tylenol toxicity and empirically treated with N acetylcysteine.  Echo showed mild concentric LVH, LVEF 45%, inferior wall severe hypokinesis, basal posterior lateral wall hypokinesis, mild LA enlargement, moderate MR without stenosis.  Due to wall motion abnormalities he was evaluated by cardiology and underwent balloon angioplasty and DES to the RCA which was 90% occluded in the OM1.  Reported an MI in 2016 with PCI to OM1 and mid RCA at that time.  He was seen in the [facility] emergency department June 2021 complaining of chest pain radiating to his back.  His CTA of the chest and high-sensitivity troponin unrevealing related to the diagnosis of bronchitis.  Echocardiogram in January 2022 revealed LVEF of 40-45%, hypokinesis of the inferior wall, G1 DD, mild MR.  He was last seen in clinic 12/18/2022.  He had recently followed up with his PCP and had blood work done that was all within normal limits.  Denied any chest pain or shortness of breath.  Stated his blood pressures running 130s to 150s at home when he takes his blood pressure.  Elevations in his blood pressures losartan was increased to 100 mg daily and he was continued on Toprol.  He was to be scheduled for repeat chemistry.  Also discussed repeating echocardiogram on return.  He returns to clinic today  stating that he has been doing well.  Denies any chest pain, shortness of breath, palpitations, peripheral edema.  States that he has been compliant with his medication.  Has not had any medication changes.  Denies any recent hospitalizations or visits to the emergency department.  ROS: 10 point review of systems has been reviewed and considered negative with exception what is been listed in HPI  Studies Reviewed: Marland Kitchen        TTE 08/27/20 1. Left ventricular ejection fraction, by estimation, is 40 to 45%. The  left ventricle has mildly decreased function. Hypokinesis of the infeiror  wall. Left ventricular diastolic parameters are consistent with Grade I  diastolic dysfunction (impaired  relaxation).   2. Right ventricular systolic function is normal. The right ventricular  size is normal.   3. The mitral valve is normal in structure. Mild mitral valve  regurgitation. No evidence of mitral stenosis.  Risk Assessment/Calculations:     HYPERTENSION CONTROL Vitals:   04/04/23 0959 04/04/23 1010  BP: (!) 162/102 (!) 158/80    [name] blood pressure is elevated above target today.  In order to address the patient's elevated BP: A new medication was prescribed today.          Physical Exam:   VS:  BP (!) 158/80 (BP Location: Left Arm, Patient Position: Sitting, Cuff Size: Normal)   Pulse 73   Ht 5' 7.72" (1.72 m)   Wt 182 lb 3.2 oz (82.6 kg)   SpO2 99%   BMI 27.94 kg/m  Wt Readings from Last 3 Encounters:  04/04/23 182 lb 3.2 oz (82.6 kg)  12/27/22 181 lb 3.2 oz (82.2 kg)  12/07/22 181 lb 14.4 oz (82.5 kg)    GEN: Well nourished, well developed in no acute distress NECK: No JVD; No carotid bruits CARDIAC: RRR, no murmurs, rubs, gallops RESPIRATORY:  Clear to auscultation without rales, wheezing or rhonchi  ABDOMEN: Soft, non-tender, non-distended EXTREMITIES:  No edema; No deformity   ASSESSMENT AND PLAN: .   Coronary disease/ischemic cardiomyopathy status post DES  to the RCA and OM1 in 2016.  Last echocardiogram revealed an LVEF of 45%.  He denies any chest pain, shortness of breath, peripheral edema, or  weight gain.  NYHA I symptoms.  Euvolemic on exam.  He is continued on aspirin and atorvastatin, losartan, Toprol-XL.  He has been scheduled for repeat echocardiogram.   Hypertension with blood pressure today 162/102 and repeat 158/80.  Patient states he runs 130s to 150s at home after he takes his medications.  Previously losartan had been increased.  He has noted an increased amount of gout and is currently on colchicine.  Will avoid thiazide and loop diuretics with active gout we will start him on amlodipine 5 mg daily for elevated blood pressure.  He is encouraged to continue to monitor his blood pressure 1 to 2 hours post medication administration.  Mixed hyperlipidemia with LDL 49 on 09/07/2022 which remains at goal.  He is continued on atorvastatin 80 mg daily without any side effects.  Rheumatoid arthritis where he is continued on Plaquenil continues to be followed by Dr. Randel Books.  History of alcohol abuse.  Patient states that he only occasionally drinks on the weekends now.  He has been scheduled for a repeat echocardiogram to reevaluate his cardiomyopathy.       Dispo: Patient to return to clinic to see MD/APP after echocardiogram is completed  Signed, Yusif Gnau, NP

## 2023-04-06 NOTE — Progress Notes (Unsigned)
Name: Marcus Gray   MRN: [id number]    DOB: Nov 28, 1955   Date:04/09/2023       Progress Note  Subjective  Chief Complaint  Follow Up  HPI  CAD/HTN/ischemic dilated cardiomyopathy  versus alcohol induced/Atherosclerosis of aorta  : patient had acute MI, with chest pain and dizziness, went to hospital in [place] back in 2016. He was admitted and had two stents placed. He moved to [facility] about 2018 and ran out of medications. He had another episode of chest pain 04/2022 but negative troponin's. He denies palpitation or SOB, he used to have heaviness on his shoulders but that has resolved also. Recently seen by cardiologist and BP was elevated and is now taking Norvasc 5 mg and also Losartan 100 mg. He is also taking Atorvastatin daily and denies side effects. Last LDL was down to 49   FINDINGS 2022 - he is going back for a repeat study   Left Ventricle: Left ventricular ejection fraction, by estimation, is 40  to 45%. The left ventricle has mildly decreased function. The left  ventricle has no regional wall motion abnormalities. The left ventricular  internal cavity size was normal in  size. There is no left ventricular hypertrophy. Left ventricular diastolic  parameters are consistent with Grade I diastolic dysfunction (impaired  relaxation).  MDD in remission : he was diagnosed with depression after his MI in 2016  he was given Citalopram, he had anhedonia, very sad and had to take medications, he took it for about 2 years He does not want to resume medication since he has been doing well at this time . 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 Allena Katz and is taking Allopurinol, last level below 4 and no flares for a while. Continue colchicine prn and allopurinol daily  - he has an upcoming appointment scheduled with him   Smoker's cough: he smoked 1-2 pack daily for 20 years ( he worked in [place] for [employer] trying out cigarettes for about 4 years) stopped for 34 years and resumed smoking about 1 year after his MI, down to about 4-5   cigarettes daily. He has a smoker's cough, usually wet in the mornings but does not expectorate,  no wheezing or sob. He had CT angiogram that showed abnormalities  ( 2. Clustered ground-glass tree-in-bud nodularity in the right upper lobe, most consistent with an infectious or inflammatory Bronchiolitis ) we will give him the phone number so he can schedule a CT lung cancer screen   HTN: bp is at goal now , taking medications daily now, losartan, Norvasc  and metoprolol and no side effects. No chest pain or palpitation   RA: he Is back seeing Dr. Allena Katz, taking plaquenil and tolerating medication well. Denies joint aches or effusion   GERD: seen by GI, taking Pepcid and doing well . Stable   B12 and folate deficiency he has been taking folic acid and B12   Atherosclerosis of aorta: found on CT angio done 04/2022, he has been compliant with statin therapy , last LDL was 49 , continue medications   Thrombocytopenia: stable,  we will recheck it next year   Patient Active Problem List   Diagnosis Date Noted   Alcoholism in remission (HCC) 12/07/2022   Atherosclerosis of aorta (HCC) 06/07/2022   Smokers' cough (HCC) 12/08/2021   Presence of stent in coronary artery 12/08/2021   History of MI (myocardial infarction) 12/08/2021   Abnormal red blood cells 12/08/2021   Low folic acid 12/08/2021   B12 deficiency 12/08/2021   Mild episode of recurrent major depressive disorder (HCC) 12/08/2021   Ischemic dilated cardiomyopathy (HCC) 12/08/2021   Long-term use of immunosuppressant medication  01/17/2021   Rheumatoid arthritis of multiple sites with negative rheumatoid factor (HCC) 11/19/2020   Idiopathic chronic gout of multiple sites without tophus 11/17/2020   Gout of multiple sites 10/13/2020   Major depression in remission (HCC) 10/13/2020   Diastolic dysfunction 10/13/2020   Coronary artery disease involving native coronary artery of native heart without angina pectoris 06/17/2020   Hypertension, benign 06/17/2020   Hyperlipidemia LDL goal <70 06/17/2020    Past Surgical History:  Procedure Laterality Date   CARDIAC CATHETERIZATION     COLONOSCOPY WITH PROPOFOL N/A 10/07/2020   Procedure: COLONOSCOPY WITH PROPOFOL;  Surgeon: Wyline Mood, MD;  Location: [facility] ENDOSCOPY;  Service: Gastroenterology;  Laterality: N/A;   CORONARY STENT PLACEMENT  2016   [place]; OM1 and RCA    Family History  Problem Relation Age of Onset   Diabetes Mother    Emphysema Father    Cerebral aneurysm Daughter     Social History   Tobacco Use   Smoking status: Every Day    Current packs/day: 1.00    Average packs/day: 1 pack/day for 54.6 years (54.6 ttl pk-yrs)    Types: Cigarettes    Start date: 09/04/1968   Smokeless tobacco: Never   Tobacco comments:    4-5 cigarettes a day  Substance Use Topics   Alcohol use: Yes    Alcohol/week: 5.0 standard drinks of alcohol    Types: 5 Glasses of wine per week    Comment: wine at night     Current Outpatient Medications:    albuterol (VENTOLIN HFA) 108 (90 Base) MCG/ACT inhaler, Inhale 2 puffs into the lungs every 4 (four) hours as needed for shortness of breath., Disp: 8 g, Rfl: 0   allopurinol (ZYLOPRIM) 100 MG tablet, Take 150 mg by mouth daily., Disp: , Rfl:    amLODipine (NORVASC) 5 MG tablet, Take 1 tablet (5 mg total) by mouth daily., Disp: 90 tablet, Rfl: 3   aspirin EC 81 MG tablet, Take 1 tablet (81 mg total) by mouth daily., Disp: 30 tablet, Rfl: 0   atorvastatin (LIPITOR) 80 MG tablet, Take 1 tablet (80 mg total) by mouth  daily., Disp: 90 tablet, Rfl: 1   cholecalciferol (VITAMIN D3) 25 MCG (1000 UNIT) tablet, Take 1,000 Units by mouth daily., Disp: , Rfl:    colchicine 0.6 MG tablet, Take 2 tablets (1.2 mg total) by mouth daily as needed. And may repeat in 24 hours if no improvement, after that may stop medication and take prn, Disp: 30 tablet, Rfl: 0   Cyanocobalamin (B-12) 500 MCG SUBL, Place 1 tablet under the tongue daily at 12 noon., Disp: 100 tablet, Rfl: 1   famotidine (PEPCID) 40 MG tablet, Take 1 tablet by mouth once daily, Disp: 90 tablet, Rfl: 3   folic acid (FOLVITE) 1 MG tablet, Take 1 mg by mouth daily., Disp: , Rfl:    hydroxychloroquine (PLAQUENIL) 200 MG tablet, Take 200 mg by  mouth 2 (two) times daily., Disp: , Rfl:    losartan (COZAAR) 100 MG tablet, Take 1 tablet (100 mg total) by mouth daily., Disp: 90 tablet, Rfl: 3   metoprolol succinate (TOPROL-XL) 25 MG 24 hr tablet, Take 1 tablet (25 mg total) by mouth daily., Disp: 90 tablet, Rfl: 1   sildenafil (VIAGRA) 100 MG tablet, Take 0.5-1 tablets (50-100 mg total) by mouth daily as needed for erectile dysfunction., Disp: 30 tablet, Rfl: 1  No Known Allergies  I personally reviewed active problem list, medication list, allergies, family history, social history, health maintenance with the patient/caregiver today.   ROS  Ten systems reviewed and is negative except as mentioned in HPI    Objective  Vitals:   04/09/23 0824  BP: 136/84  Pulse: 88  Resp: 16  SpO2: 96%  Weight: 179 lb (81.2 kg)  Height: 5\' 7"  (1.702 m)    Body mass index is 28.04 kg/m.  Physical Exam  Constitutional: Patient appears well-developed and well-nourished.  No distress.  HEENT: head atraumatic, normocephalic, pupils equal and reactive to light, neck supple, Cardiovascular: Normal rate, regular rhythm and normal heart sounds.  No murmur heard. No BLE edema. Pulmonary/Chest: Effort normal and breath sounds normal. No respiratory distress. Abdominal: Soft.   There is no tenderness. Psychiatric: Patient has a normal mood and affect. behavior is normal. Judgment and thought content normal.   No results found for this or any previous visit (from the past 2160 hour(s)).   PHQ2/9:    04/09/2023    8:23 AM 12/07/2022    9:09 AM 09/07/2022    8:48 AM 06/07/2022    8:40 AM 05/30/2022    8:38 AM  Depression screen PHQ 2/9  Decreased Interest 0 0 0 0 0  Down, Depressed, Hopeless 0 0 0 0 0  PHQ - 2 Score 0 0 0 0 0  Altered sleeping 0 0 0 0 0  Tired, decreased energy 0 0 0 0 0  Change in appetite 0 0 0 0 0  Feeling bad or failure about yourself  0 0 0 0 0  Trouble concentrating 0 0 0 0 0  Moving slowly or fidgety/restless 0 0 0 0 0  Suicidal thoughts 0 0 0 0 0  PHQ-9 Score 0 0 0 0 0    phq 9 is negative   Fall Risk:    04/09/2023    8:23 AM 12/07/2022    9:09 AM 09/07/2022    8:48 AM 06/07/2022    8:40 AM 05/30/2022    8:38 AM  Fall Risk   Falls in the past year? 0 0 0 0 0  Number falls in past yr: 0  0 0   Injury with Fall? 0  0 0   Risk for fall due to : No Fall Risks No Fall Risks No Fall Risks No Fall Risks   Follow up Falls prevention discussed Falls prevention discussed Falls prevention discussed Falls prevention discussed Falls prevention discussed;Education provided;Falls evaluation completed      Functional Status Survey: Is the patient deaf or have difficulty hearing?: No Does the patient have difficulty seeing, even when wearing glasses/contacts?: No Does the patient have difficulty concentrating, remembering, or making decisions?: No Does the patient have difficulty walking or climbing stairs?: No Does the patient have difficulty dressing or bathing?: No Does the patient have difficulty doing errands alone such as visiting a doctor's office or shopping?: No    Assessment & Plan  1. Smokers' cough (  HCC)  Trying to cut down on amount per day   2. Ischemic dilated cardiomyopathy (HCC)  Going to have repeat echo soon    3. Rheumatoid arthritis of multiple sites with negative rheumatoid factor (HCC)  Keep follow up with Dr. Allena Katz   4. Alcoholism in remission (HCC)  Only drinking on the weekends   5. Major depression in remission [facility])  Doing well   6. Atherosclerosis of aorta (HCC)  On statin therapy   7. Controlled gout  Controlled now   8. B12 deficiency  Continue supplementation   9. History of MI (myocardial infarction)  - atorvastatin (LIPITOR) 80 MG tablet; Take 1 tablet (80 mg total) by mouth daily.  Dispense: 90 tablet; Refill: 1 - metoprolol succinate (TOPROL-XL) 25 MG 24 hr tablet; Take 1 tablet (25 mg total) by mouth daily.  Dispense: 90 tablet; Refill: 1  10. Coronary artery disease involving native coronary artery of native heart without angina pectoris  - atorvastatin (LIPITOR) 80 MG tablet; Take 1 tablet (80 mg total) by mouth daily.  Dispense: 90 tablet; Refill: 1  11. Hypertension, benign  - metoprolol succinate (TOPROL-XL) 25 MG 24 hr tablet; Take 1 tablet (25 mg total) by mouth daily.  Dispense: 90 tablet; Refill: 1

## 2023-04-09 ENCOUNTER — Encounter: Payer: Self-pay | Admitting: Family Medicine

## 2023-04-09 ENCOUNTER — Ambulatory Visit (INDEPENDENT_AMBULATORY_CARE_PROVIDER_SITE_OTHER): Payer: Medicare Other | Admitting: Family Medicine

## 2023-04-09 VITALS — BP 136/84 | HR 88 | Resp 16 | Ht 67.0 in | Wt 179.0 lb

## 2023-04-09 DIAGNOSIS — M109 Gout, unspecified: Secondary | ICD-10-CM | POA: Diagnosis not present

## 2023-04-09 DIAGNOSIS — M0609 Rheumatoid arthritis without rheumatoid factor, multiple sites: Secondary | ICD-10-CM

## 2023-04-09 DIAGNOSIS — I255 Ischemic cardiomyopathy: Secondary | ICD-10-CM | POA: Diagnosis not present

## 2023-04-09 DIAGNOSIS — E538 Deficiency of other specified B group vitamins: Secondary | ICD-10-CM

## 2023-04-09 DIAGNOSIS — I2510 Atherosclerotic heart disease of native coronary artery without angina pectoris: Secondary | ICD-10-CM

## 2023-04-09 DIAGNOSIS — I420 Dilated cardiomyopathy: Secondary | ICD-10-CM

## 2023-04-09 DIAGNOSIS — F325 Major depressive disorder, single episode, in full remission: Secondary | ICD-10-CM

## 2023-04-09 DIAGNOSIS — F1021 Alcohol dependence, in remission: Secondary | ICD-10-CM

## 2023-04-09 DIAGNOSIS — Z23 Encounter for immunization: Secondary | ICD-10-CM | POA: Diagnosis not present

## 2023-04-09 DIAGNOSIS — I252 Old myocardial infarction: Secondary | ICD-10-CM | POA: Diagnosis not present

## 2023-04-09 DIAGNOSIS — J410 Simple chronic bronchitis: Secondary | ICD-10-CM

## 2023-04-09 DIAGNOSIS — I700 Atherosclerosis of aorta: Secondary | ICD-10-CM

## 2023-04-09 MED ORDER — ATORVASTATIN CALCIUM 80 MG PO TABS
80.0000 mg | ORAL_TABLET | Freq: Every day | ORAL | 1 refills | Status: DC
Start: 2023-04-09 — End: 2023-10-08

## 2023-04-09 MED ORDER — METOPROLOL SUCCINATE ER 25 MG PO TB24: 25.0000 mg | ORAL_TABLET | Freq: Every day | ORAL | 1 refills | Status: DC

## 2023-04-09 NOTE — Patient Instructions (Signed)
RSV vacina na farmacia

## 2023-04-30 ENCOUNTER — Ambulatory Visit: Payer: Medicare Other | Attending: Cardiology

## 2023-04-30 DIAGNOSIS — I119 Hypertensive heart disease without heart failure: Secondary | ICD-10-CM

## 2023-04-30 DIAGNOSIS — I43 Cardiomyopathy in diseases classified elsewhere: Secondary | ICD-10-CM | POA: Diagnosis not present

## 2023-04-30 LAB — ECHOCARDIOGRAM COMPLETE
Area-P 1/2: 3.37 cm2
S' Lateral: 4.4 cm

## 2023-05-01 NOTE — Progress Notes (Signed)
Heart squeeze of 45 to 50% with some muscle stiffness noted.  This can be related to age or blood pressure.  There is also mild mitral valve regurgitation or leakage.  Overall the study is stable and predominantly unchanged from prior study.

## 2023-06-02 ENCOUNTER — Other Ambulatory Visit: Payer: Self-pay | Admitting: Gastroenterology

## 2023-06-02 ENCOUNTER — Other Ambulatory Visit: Payer: Self-pay | Admitting: Family Medicine

## 2023-06-04 ENCOUNTER — Ambulatory Visit: Payer: Medicare Other | Admitting: Cardiology

## 2023-06-04 ENCOUNTER — Other Ambulatory Visit: Payer: Self-pay

## 2023-10-08 ENCOUNTER — Ambulatory Visit (INDEPENDENT_AMBULATORY_CARE_PROVIDER_SITE_OTHER): Payer: Medicare Other | Admitting: Family Medicine

## 2023-10-08 ENCOUNTER — Ambulatory Visit: Payer: Medicare Other | Admitting: Family Medicine

## 2023-10-08 ENCOUNTER — Encounter: Payer: Self-pay | Admitting: Family Medicine

## 2023-10-08 VITALS — BP 124/72 | HR 84 | Resp 16 | Ht 67.0 in | Wt 174.2 lb

## 2023-10-08 DIAGNOSIS — R918 Other nonspecific abnormal finding of lung field: Secondary | ICD-10-CM

## 2023-10-08 DIAGNOSIS — N521 Erectile dysfunction due to diseases classified elsewhere: Secondary | ICD-10-CM

## 2023-10-08 DIAGNOSIS — Z79899 Other long term (current) drug therapy: Secondary | ICD-10-CM

## 2023-10-08 DIAGNOSIS — F1021 Alcohol dependence, in remission: Secondary | ICD-10-CM

## 2023-10-08 DIAGNOSIS — D696 Thrombocytopenia, unspecified: Secondary | ICD-10-CM

## 2023-10-08 DIAGNOSIS — I420 Dilated cardiomyopathy: Secondary | ICD-10-CM

## 2023-10-08 DIAGNOSIS — J410 Simple chronic bronchitis: Secondary | ICD-10-CM

## 2023-10-08 DIAGNOSIS — I700 Atherosclerosis of aorta: Secondary | ICD-10-CM

## 2023-10-08 DIAGNOSIS — E559 Vitamin D deficiency, unspecified: Secondary | ICD-10-CM

## 2023-10-08 DIAGNOSIS — I2510 Atherosclerotic heart disease of native coronary artery without angina pectoris: Secondary | ICD-10-CM

## 2023-10-08 DIAGNOSIS — M0609 Rheumatoid arthritis without rheumatoid factor, multiple sites: Secondary | ICD-10-CM

## 2023-10-08 DIAGNOSIS — F325 Major depressive disorder, single episode, in full remission: Secondary | ICD-10-CM

## 2023-10-08 DIAGNOSIS — I255 Ischemic cardiomyopathy: Secondary | ICD-10-CM

## 2023-10-08 DIAGNOSIS — I252 Old myocardial infarction: Secondary | ICD-10-CM

## 2023-10-08 MED ORDER — SILDENAFIL CITRATE 100 MG PO TABS: 50.0000 mg | ORAL_TABLET | Freq: Every day | ORAL | 1 refills | Status: AC | PRN

## 2023-10-08 MED ORDER — METOPROLOL SUCCINATE ER 25 MG PO TB24: 25.0000 mg | ORAL_TABLET | Freq: Every day | ORAL | 1 refills | Status: DC

## 2023-10-08 MED ORDER — ATORVASTATIN CALCIUM 80 MG PO TABS: 80.0000 mg | ORAL_TABLET | Freq: Every day | ORAL | 1 refills | Status: DC

## 2023-10-08 NOTE — Addendum Note (Signed)
 Addended by: Alba Cory F on: 10/08/2023 12:53 PM   Modules accepted: Level of Service

## 2023-10-08 NOTE — Progress Notes (Signed)
 Name: Marcus Gray   MRN: [id number]    DOB: 12/16/1955   Date:10/08/2023       Progress Note  Subjective  Chief Complaint  Chief Complaint  Patient presents with   Medical Management of Chronic Issues   HPI   CAD/ischemic dilated cardiomyopathy versus alcohol induced/Atherosclerosis of aorta  : patient had acute MI, with chest pain and dizziness, went to hospital in [place] back in 2016. He was admitted and had two stents placed. He moved to [facility] about 2018 and ran out of medications. He had another episode of chest pain 04/2022 but negative troponin's. He denies palpitation or SOB, he used to have heaviness on his shoulders but that has resolved also.  BP is now at goal, he has been out of metoprolol but will resume taking it today.Marland Kitchen He is also taking Atorvastatin daily and denies side effects. Last LDL was down to 49 . 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 Allena Katz and is taking Allopurinol, last level below 4 and no flares for a while. Continue colchicine prn and allopurinol daily , last uric acid was normal    Smoker's cough: he smoked 1-2 pack daily for 20 years ( he worked in [place] for [employer] trying out cigarettes for about 4 years) stopped for 34 years and resumed smoking about 1 year after his MI, down to about 4-5   cigarettes daily. He has a smoker's  cough, usually wet in the mornings but does not expectorate,  no wheezing or sob. He had CT angiogram that showed abnormalities  ( 2. Clustered ground-glass tree-in-bud nodularity in the right upper lobe, most consistent with an infectious or inflammatory bronchitis) he was given number to schedule a lung cancer screen but is not interested in having it done. He states cough has improved   HTN: bp is at goal now , taking medications daily now, losartan, Norvasc  and metoprolol ( out for the past month )  and no side effects. No chest pain or palpitation.   RA: he Is back seeing Dr. Allena Katz, taking plaquenil and tolerating medication well. Reminded him to schedule a follow up   GERD: seen by GI, taking Pepcid and doing well . He states he has been doing well   B12 and folate deficiency he has been taking folic acid and B12 , MCV was very high and we will also check B1   Atherosclerosis of aorta: found on CT angio done 04/2022, he has been compliant with statin therapy , last LDL was 49 , we will recheck labs    Thrombocytopenia: we will recheck labs   Patient Active Problem List   Diagnosis Date Noted   Alcoholism in remission (HCC) 12/07/2022   Atherosclerosis of aorta (HCC)  06/07/2022   Smokers' cough (HCC) 12/08/2021   Presence of stent in coronary artery 12/08/2021   History of MI (myocardial infarction) 12/08/2021   Abnormal red blood cells 12/08/2021   Low folic acid 12/08/2021   B12 deficiency 12/08/2021   Mild episode of recurrent major depressive disorder (HCC) 12/08/2021   Ischemic dilated cardiomyopathy (HCC) 12/08/2021   Long-term use of immunosuppressant medication 01/17/2021   Rheumatoid arthritis of multiple sites with negative rheumatoid factor (HCC) 11/19/2020   Idiopathic chronic gout of multiple sites without tophus 11/17/2020   Gout of multiple sites 10/13/2020   Major depression in remission (HCC) 10/13/2020   Diastolic dysfunction 10/13/2020   Coronary artery  disease involving native coronary artery of native heart without angina pectoris 06/17/2020   Hypertension, benign 06/17/2020   Hyperlipidemia LDL goal <70 06/17/2020    Past Surgical History:  Procedure Laterality Date   CARDIAC CATHETERIZATION     COLONOSCOPY WITH PROPOFOL N/A 10/07/2020   Procedure: COLONOSCOPY WITH PROPOFOL;  Surgeon: Wyline Mood, MD;  Location: [facility] ENDOSCOPY;  Service: Gastroenterology;  Laterality: N/A;   CORONARY STENT PLACEMENT  2016   [place]; OM1 and RCA    Family History  Problem Relation Age of Onset   Diabetes Mother    Emphysema Father    Cerebral aneurysm Daughter     Social History   Tobacco Use   Smoking status: Every Day    Current packs/day: 1.00    Average packs/day: 1 pack/day for 55.1 years (55.1 ttl pk-yrs)    Types: Cigarettes    Start date: 09/04/1968   Smokeless tobacco: Never   Tobacco comments:    4-5 cigarettes a day  Substance Use Topics   Alcohol use: Yes    Alcohol/week: 5.0 standard drinks of alcohol    Types: 5 Glasses of wine per week    Comment: wine at night     Current Outpatient Medications:    albuterol (VENTOLIN HFA) 108 (90 Base) MCG/ACT inhaler, Inhale 2 puffs into the lungs every 4 (four) hours as needed for shortness of breath., Disp: 8 g, Rfl: 0   allopurinol (ZYLOPRIM) 100 MG tablet, Take 150 mg by mouth daily., Disp: , Rfl:    aspirin EC 81 MG tablet, Take 1 tablet (81 mg total) by mouth daily., Disp: 30 tablet, Rfl: 0   atorvastatin (LIPITOR) 80 MG tablet, Take 1 tablet (80 mg total) by mouth daily., Disp: 90 tablet, Rfl: 1   cholecalciferol (VITAMIN D3) 25 MCG (1000 UNIT) tablet, Take 1,000 Units by mouth daily., Disp: , Rfl:    colchicine 0.6 MG tablet, Take 2 tablets (1.2 mg total) by mouth daily as needed. And may repeat in 24 hours if no improvement, after that may stop medication and take prn, Disp: 30 tablet, Rfl: 0   Cyanocobalamin (B-12) 500 MCG SUBL, Place 1 tablet under the tongue daily at  12 noon., Disp: 100 tablet, Rfl: 1   famotidine (PEPCID) 40 MG tablet, Take 1 tablet by mouth once daily, Disp: 30 tablet, Rfl: 0   folic acid (FOLVITE) 1 MG tablet, Take 1 mg by mouth daily., Disp: , Rfl:    hydroxychloroquine (PLAQUENIL) 200 MG tablet, Take 200 mg by mouth 2 (two) times daily., Disp: , Rfl:    losartan (COZAAR) 100 MG tablet, Take 1 tablet (100 mg total) by mouth daily., Disp: 90 tablet, Rfl: 3   metoprolol succinate (TOPROL-XL) 25 MG 24 hr tablet, Take 1 tablet (25 mg total) by mouth daily., Disp: 90  tablet, Rfl: 1   sildenafil (VIAGRA) 100 MG tablet, Take 0.5-1 tablets (50-100 mg total) by mouth daily as needed for erectile dysfunction., Disp: 30 tablet, Rfl: 1   amLODipine (NORVASC) 5 MG tablet, Take 1 tablet (5 mg total) by mouth daily., Disp: 90 tablet, Rfl: 3  No Known Allergies  I personally reviewed active problem list, medication list, allergies, family history with the patient/caregiver today.   ROS  Ten systems reviewed and is negative except as mentioned in HPI    Objective  Vitals:   10/08/23 0926  BP: 124/72  Pulse: 84  Resp: 16  SpO2: 100%  Weight: 174 lb 3.2 oz (79 kg)  Height: 5\' 7"  (1.702 m)    Body mass index is 27.28 kg/m.  Physical Exam  Constitutional: Patient appears well-developed and well-nourished.  No distress.  HEENT: head atraumatic, normocephalic, pupils equal and reactive to light, neck supple Cardiovascular: Normal rate, regular rhythm and normal heart sounds.  No murmur heard. No BLE edema. Pulmonary/Chest: Effort normal and breath sounds normal. No respiratory distress. Abdominal: Soft.  There is no tenderness. Psychiatric: Patient has a normal mood and affect. behavior is normal. Judgment and thought content normal.   Diabetic Foot Exam:     PHQ2/9:    10/08/2023    9:27 AM 04/09/2023    8:23 AM 12/07/2022    9:09 AM 09/07/2022    8:48 AM 06/07/2022    8:40 AM  Depression screen PHQ 2/9  Decreased Interest 0 0 0 0 0   Down, Depressed, Hopeless 0 0 0 0 0  PHQ - 2 Score 0 0 0 0 0  Altered sleeping 0 0 0 0 0  Tired, decreased energy 0 0 0 0 0  Change in appetite 0 0 0 0 0  Feeling bad or failure about yourself  0 0 0 0 0  Trouble concentrating 0 0 0 0 0  Moving slowly or fidgety/restless 0 0 0 0 0  Suicidal thoughts 0 0 0 0 0  PHQ-9 Score 0 0 0 0 0  Difficult doing work/chores Not difficult at all        phq 9 is negative  Fall Risk:    10/08/2023    9:24 AM 04/09/2023    8:23 AM 12/07/2022    9:09 AM 09/07/2022    8:48 AM 06/07/2022    8:40 AM  Fall Risk   Falls in the past year? 0 0 0 0 0  Number falls in past yr: 0 0  0 0  Injury with Fall? 0 0  0 0  Risk for fall due to : No Fall Risks No Fall Risks No Fall Risks No Fall Risks No Fall Risks  Follow up Falls prevention discussed;Education provided;Falls evaluation completed Falls prevention discussed Falls prevention discussed Falls prevention discussed Falls prevention discussed     Assessment & Plan  1. Smokers' cough (HCC) (Primary)  Improved, states feeling well and not using inhaler  2. Ischemic dilated cardiomyopathy (HCC)  - metoprolol succinate (TOPROL-XL) 25 MG 24 hr tablet; Take 1 tablet (25 mg total) by mouth daily.  Dispense: 90 tablet; Refill: 1 - atorvastatin (LIPITOR) 80 MG tablet; Take 1 tablet (80 mg total) by mouth daily.  Dispense: 90 tablet; Refill: 1  3. Rheumatoid arthritis of multiple sites with negative rheumatoid factor (HCC)  Needs to follow up with Dr. Allena Katz   4. Atherosclerosis of aorta (HCC)  - Lipid panel - atorvastatin (LIPITOR) 80 MG tablet; Take 1  tablet (80 mg total) by mouth daily.  Dispense: 90 tablet; Refill: 1  5. Major depression in remission [facility])  Doing well   6. Alcoholism in remission (HCC)  - B12 and Folate Panel - Vitamin B1  7. Thrombocytopenia (HCC)  - CBC with Differential/Platelet  8. Coronary artery disease involving native coronary artery of native heart without angina  pectoris  - atorvastatin (LIPITOR) 80 MG tablet; Take 1 tablet (80 mg total) by mouth daily.  Dispense: 90 tablet; Refill: 1  9. Erectile dysfunction due to diseases classified elsewhere  - sildenafil (VIAGRA) 100 MG tablet; Take 0.5-1 tablets (50-100 mg total) by mouth daily as needed for erectile dysfunction.  Dispense: 30 tablet; Refill: 1  10. Long-term use of high-risk medication  - CBC with Differential/Platelet - COMPLETE METABOLIC PANEL WITH GFR  11. Vitamin D deficiency  - VITAMIN D 25 Hydroxy (Vit-D Deficiency, Fractures)  12. History of MI (myocardial infarction)  - metoprolol succinate (TOPROL-XL) 25 MG 24 hr tablet; Take 1 tablet (25 mg total) by mouth daily.  Dispense: 90 tablet; Refill: 1 - atorvastatin (LIPITOR) 80 MG tablet; Take 1 tablet (80 mg total) by mouth daily.  Dispense: 90 tablet; Refill: 1   13. Abnormal CT scan of lung  Discussed repeating study but he would like to hold off

## 2023-10-08 NOTE — Patient Instructions (Addendum)
 Team Member Role and Specialty Contact Info Address Start End Comments  End, Cristal Deer, MD Cardiology (Cardiology) Phone: [phone] Fax: [phone] [address] [place] [address] 07/07/2020 - -   Team Member Role and Specialty Contact Info Address Start End Comments  Patterson Hammersmith, MD Consulting Physician (Rheumatology) Phone: [phone] Fax: [phone] [address] [place] [address] 10/08/2023 - -

## 2023-10-11 LAB — B12 AND FOLATE PANEL
Folate: 3.6 ng/mL — ABNORMAL LOW
Vitamin B-12: 332 pg/mL (ref 200–1100)

## 2023-10-11 LAB — COMPLETE METABOLIC PANEL WITH GFR
AG Ratio: 1.6 (calc) (ref 1.0–2.5)
ALT: 32 U/L (ref 9–46)
AST: 36 U/L — ABNORMAL HIGH (ref 10–35)
Albumin: 4.5 g/dL (ref 3.6–5.1)
Alkaline phosphatase (APISO): 67 U/L (ref 35–144)
BUN: 7 mg/dL (ref 7–25)
CO2: 26 mmol/L (ref 20–32)
Calcium: 9.5 mg/dL (ref 8.6–10.3)
Chloride: 106 mmol/L (ref 98–110)
Creat: 1.2 mg/dL (ref 0.70–1.35)
Globulin: 2.8 g/dL (ref 1.9–3.7)
Glucose, Bld: 105 mg/dL — ABNORMAL HIGH (ref 65–99)
Potassium: 4.3 mmol/L (ref 3.5–5.3)
Sodium: 141 mmol/L (ref 135–146)
Total Bilirubin: 0.7 mg/dL (ref 0.2–1.2)
Total Protein: 7.3 g/dL (ref 6.1–8.1)
eGFR: 65 mL/min/{1.73_m2} (ref >=60)

## 2023-10-11 LAB — CBC WITH DIFFERENTIAL/PLATELET
Absolute Lymphocytes: 2356 {cells}/uL (ref 850–3900)
Absolute Monocytes: 851 {cells}/uL (ref 200–950)
Basophils Absolute: 32 {cells}/uL (ref 0–200)
Basophils Relative: 0.5 %
Eosinophils Absolute: 63 {cells}/uL (ref 15–500)
Eosinophils Relative: 1 %
HCT: 54.6 % — ABNORMAL HIGH (ref 38.5–50.0)
Hemoglobin: 19.2 g/dL — ABNORMAL HIGH (ref 13.2–17.1)
MCH: 37.5 pg — ABNORMAL HIGH (ref 27.0–33.0)
MCHC: 35.2 g/dL (ref 32.0–36.0)
MCV: 106.6 fL — ABNORMAL HIGH (ref 80.0–100.0)
MPV: 12.7 fL — ABNORMAL HIGH (ref 7.5–12.5)
Monocytes Relative: 13.5 %
Neutro Abs: 2999 {cells}/uL (ref 1500–7800)
Neutrophils Relative %: 47.6 %
Platelets: 153 10*3/uL (ref 140–400)
RBC: 5.12 10*6/uL (ref 4.20–5.80)
RDW: 14 % (ref 11.0–15.0)
Total Lymphocyte: 37.4 %
WBC: 6.3 10*3/uL (ref 3.8–10.8)

## 2023-10-11 LAB — VITAMIN B1: Vitamin B1 (Thiamine): <6 nmol/L — ABNORMAL LOW (ref 8–30)

## 2023-10-11 LAB — LIPID PANEL
Cholesterol: 180 mg/dL (ref <200)
HDL: 48 mg/dL (ref >=40)
LDL Cholesterol (Calc): 105 mg/dL — ABNORMAL HIGH
Non-HDL Cholesterol (Calc): 132 mg/dL — ABNORMAL HIGH (ref <130)
Total CHOL/HDL Ratio: 3.8 (calc) (ref <5.0)
Triglycerides: 155 mg/dL — ABNORMAL HIGH (ref <150)

## 2023-10-11 LAB — VITAMIN D 25 HYDROXY (VIT D DEFICIENCY, FRACTURES): Vit D, 25-Hydroxy: 16 ng/mL — ABNORMAL LOW (ref 30–100)

## 2024-01-09 ENCOUNTER — Encounter: Payer: Self-pay | Admitting: Family Medicine

## 2024-01-09 ENCOUNTER — Ambulatory Visit (INDEPENDENT_AMBULATORY_CARE_PROVIDER_SITE_OTHER): Admitting: Family Medicine

## 2024-01-09 VITALS — BP 136/80 | HR 91 | Resp 16 | Ht 67.5 in | Wt 173.9 lb

## 2024-01-09 DIAGNOSIS — E538 Deficiency of other specified B group vitamins: Secondary | ICD-10-CM

## 2024-01-09 DIAGNOSIS — Z0000 Encounter for general adult medical examination without abnormal findings: Secondary | ICD-10-CM

## 2024-01-09 DIAGNOSIS — E539 Vitamin B deficiency, unspecified: Secondary | ICD-10-CM

## 2024-01-09 NOTE — Progress Notes (Signed)
 Name: Marcus Gray   MRN: [id number]    DOB: July 11, 1956   Date:01/09/2024       Progress Note  Subjective  Chief Complaint  Chief Complaint  Patient presents with   Annual Exam    HPI  Patient presents for annual CPE .   IPSS     Row Name 01/09/24 0831         International Prostate Symptom Score   How often have you had the sensation of not emptying your bladder? Not at All     How often have you had to urinate less than every two hours? Not at All     How often have you found you stopped and started again several times when you urinated? Not at All     How often have you found it difficult to postpone urination? Not at All     How often have you had a weak urinary stream? Not at All     How often have you had to strain to start urination? Not at All     How many times did you typically get up at night to urinate? None     Total IPSS Score 0       Quality of Life due to urinary symptoms   If you were to spend the rest of your life with your urinary condition just the way it is now how would you feel about that? Pleased              Diet: cook at home, balanced diet Exercise: walks most days 20-30 minutes per day  Last Dental Exam: up to date  Last Eye Exam: up to date   Depression: phq 9 is negative    01/09/2024    8:31 AM 10/08/2023    9:27 AM 04/09/2023    8:23 AM 12/07/2022    9:09 AM 09/07/2022    8:48 AM  Depression screen PHQ 2/9  Decreased Interest 0 0 0 0 0  Down, Depressed, Hopeless 0 0 0 0 0  PHQ - 2 Score 0 0 0 0 0  Altered sleeping 0 0 0 0 0  Tired, decreased energy 0 0 0 0 0  Change in appetite 0 0 0 0 0  Feeling bad or failure about yourself  0 0 0 0 0  Trouble concentrating 0 0 0 0 0  Moving slowly or fidgety/restless 0 0 0 0 0  Suicidal thoughts 0 0 0 0 0  PHQ-9 Score 0 0 0 0 0  Difficult doing work/chores Not difficult at all Not difficult at all       Hypertension:  BP Readings from Last 3 Encounters:  01/09/24 136/80  10/08/23  124/72  04/09/23 136/84    Obesity: Wt Readings from Last 3 Encounters:  01/09/24 173 lb 14.4 oz (78.9 kg)  10/08/23 174 lb 3.2 oz (79 kg)  04/09/23 179 lb (81.2 kg)   BMI Readings from Last 3 Encounters:  01/09/24 26.83 kg/m  10/08/23 27.28 kg/m  04/09/23 28.04 kg/m     [employer] Visit from 01/09/2024 in [facility]  AUDIT-C Score 5        Married STD testing and prevention (HIV/chl/gon/syphilis):  not applicable Sexual history: one partner Hep C Screening: completed Skin cancer: Discussed monitoring for atypical lesions Colorectal cancer: repeat in 2029 Prostate cancer:  not applicable   Lung cancer:  Low Dose CT Chest recommended if Age 52-80 years, 30 pack-year  currently smoking OR have quit w/in 15years. Patient  is a candidate for screening  but not interested  AAA: CT angiogram normal in 2023 ECG:  11/2022  Vaccines: reviewed with the patient.   Advanced Care Planning: A voluntary discussion about advance care planning including the explanation and discussion of advance directives.  Discussed health care proxy and Living will, and the patient was able to identify a health care proxy as wife .  Patient does have a living will and power of attorney of health care   Patient Active Problem List   Diagnosis Date Noted   Alcoholism in remission (HCC) 12/07/2022   Atherosclerosis of aorta (HCC) 06/07/2022   Smokers' cough (HCC) 12/08/2021   Presence of stent in coronary artery 12/08/2021   History of MI (myocardial infarction) 12/08/2021   Abnormal red blood cells 12/08/2021   Low folic acid  12/08/2021   B12 deficiency 12/08/2021   Mild episode of recurrent major depressive disorder (HCC) 12/08/2021   Ischemic dilated cardiomyopathy (HCC) 12/08/2021   Long-term use of immunosuppressant medication 01/17/2021   Rheumatoid arthritis of multiple sites with negative rheumatoid factor (HCC) 11/19/2020   Idiopathic chronic gout of  multiple sites without tophus 11/17/2020   Gout of multiple sites 10/13/2020   Major depression in remission (HCC) 10/13/2020   Diastolic dysfunction 10/13/2020   Coronary artery disease involving native coronary artery of native heart without angina pectoris 06/17/2020   Hypertension, benign 06/17/2020   Hyperlipidemia LDL goal <70 06/17/2020    Past Surgical History:  Procedure Laterality Date   CARDIAC CATHETERIZATION     COLONOSCOPY WITH PROPOFOL  N/A 10/07/2020   Procedure: COLONOSCOPY WITH PROPOFOL ;  Surgeon: Luke Salaam, MD;  Location: [facility];  Service: Gastroenterology;  Laterality: N/A;   CORONARY STENT PLACEMENT  2016   [place]; OM1 and RCA    Family History  Problem Relation Age of Onset   Diabetes Mother    Emphysema Father    Cerebral aneurysm Daughter     Social History   Socioeconomic History   Marital status: Married    Spouse name: Latrell Potempa    Number of children: 2   Years of education: Not on file   Highest education level: Some college, no degree  Occupational History   Occupation: retired     Comment: Corporate investment banker   Tobacco Use   Smoking status: Every Day    Current packs/day: 1.00    Average packs/day: 1 pack/day for 55.3 years (55.3 ttl pk-yrs)    Types: Cigarettes    Start date: 09/04/1968   Smokeless tobacco: Never   Tobacco comments:    4-5 cigarettes a day  Vaping Use   Vaping status: Never Used  Substance and Sexual Activity   Alcohol use: Yes    Alcohol/week: 5.0 standard drinks of alcohol    Types: 5 Glasses of wine per week    Comment: wine at night   Drug use: Never   Sexual activity: Not on file  Other Topics Concern   Not on file  Social History Narrative   ** Merged History Encounter **       Born in [place], has two children Moved to [place]  in 2000    Social Drivers of Health   Financial Resource Strain: Low Risk  (01/09/2024)   Overall Financial Resource Strain (CARDIA)    Difficulty of Paying  Living Expenses: Not hard at all  Food Insecurity: No Food Insecurity (01/09/2024)   Hunger Vital Sign  Worried About Programme researcher, broadcasting/film/video in the Last Year: Never true    Ran Out of Food in the Last Year: Never true  Transportation Needs: No Transportation Needs (01/09/2024)   PRAPARE - Administrator, Civil Service (Medical): No    Lack of Transportation (Non-Medical): No  Physical Activity: Sufficiently Active (01/09/2024)   Exercise Vital Sign    Days of Exercise per Week: 5 days    Minutes of Exercise per Session: 30 min  Stress: No Stress Concern Present (01/09/2024)   [employer] of Occupational Health - Occupational Stress Questionnaire    Feeling of Stress : Not at all  Social Connections: Socially Integrated (01/09/2024)   Social Connection and Isolation Panel [NHANES]    Frequency of Communication with Friends and Family: More than three times a week    Frequency of Social Gatherings with Friends and Family: More than three times a week    Attends Religious Services: More than 4 times per year    Active Member of [employer] or Organizations: Yes    Attends Engineer, structural: More than 4 times per year    Marital Status: Married  Catering manager Violence: Not At Risk (01/09/2024)   Humiliation, Afraid, Rape, and Kick questionnaire    Fear of Current or Ex-Partner: No    Emotionally Abused: No    Physically Abused: No    Sexually Abused: No     Current Outpatient Medications:    albuterol  (VENTOLIN  HFA) 108 (90 Base) MCG/ACT inhaler, Inhale 2 puffs into the lungs every 4 (four) hours as needed for shortness of breath., Disp: 8 g, Rfl: 0   allopurinol  (ZYLOPRIM ) 100 MG tablet, Take 150 mg by mouth daily., Disp: , Rfl:    aspirin  EC 81 MG tablet, Take 1 tablet (81 mg total) by mouth daily., Disp: 30 tablet, Rfl: 0   atorvastatin  (LIPITOR) 80 MG tablet, Take 1 tablet (80 mg total) by mouth daily., Disp: 90 tablet, Rfl: 1   cholecalciferol (VITAMIN D3) 25  MCG (1000 UNIT) tablet, Take 1,000 Units by mouth daily., Disp: , Rfl:    colchicine  0.6 MG tablet, Take 2 tablets (1.2 mg total) by mouth daily as needed. And may repeat in 24 hours if no improvement, after that may stop medication and take prn, Disp: 30 tablet, Rfl: 0   Cyanocobalamin  (B-12) 500 MCG SUBL, Place 1 tablet under the tongue daily at 12 noon., Disp: 100 tablet, Rfl: 1   famotidine  (PEPCID ) 40 MG tablet, Take 1 tablet by mouth once daily, Disp: 30 tablet, Rfl: 0   folic acid  (FOLVITE ) 1 MG tablet, Take 1 mg by mouth daily., Disp: , Rfl:    hydroxychloroquine (PLAQUENIL) 200 MG tablet, Take 200 mg by mouth 2 (two) times daily., Disp: , Rfl:    losartan  (COZAAR ) 100 MG tablet, Take 1 tablet (100 mg total) by mouth daily., Disp: 90 tablet, Rfl: 3   metoprolol  succinate (TOPROL -XL) 25 MG 24 hr tablet, Take 1 tablet (25 mg total) by mouth daily., Disp: 90 tablet, Rfl: 1   sildenafil  (VIAGRA ) 100 MG tablet, Take 0.5-1 tablets (50-100 mg total) by mouth daily as needed for erectile dysfunction., Disp: 30 tablet, Rfl: 1   amLODipine  (NORVASC ) 5 MG tablet, Take 1 tablet (5 mg total) by mouth daily., Disp: 90 tablet, Rfl: 3  No Known Allergies   ROS  Ten systems reviewed and is negative except as mentioned in HPI     Objective  Vitals:  01/09/24 0836  BP: 136/80  Pulse: 91  Resp: 16  SpO2: 98%  Weight: 173 lb 14.4 oz (78.9 kg)  Height: 5' 7.5 (1.715 m)    Body mass index is 26.83 kg/m.  Physical Exam  Constitutional: Patient appears well-developed and well-nourished. No distress.  HENT: Head: Normocephalic and atraumatic. Ears: B TMs ok, no erythema or effusion; Nose: Nose normal. Mouth/Throat: Oropharynx is clear and moist. No oropharyngeal exudate.  Eyes: Conjunctivae and EOM are normal. Pupils are equal, round, and reactive to light. No scleral icterus.  Neck: Normal range of motion. Neck supple. No JVD present. No thyromegaly present.  Cardiovascular: Normal rate,  regular rhythm and normal heart sounds.  No murmur heard. No BLE edema. Pulmonary/Chest: Effort normal and breath sounds normal. No respiratory distress. Abdominal: Soft. Bowel sounds are normal, no distension. There is no tenderness. no masses MALE GENITALIA: refused  RECTAL: refused  Musculoskeletal: Normal range of motion, no joint effusions. No gross deformities Neurological: he is alert and oriented to person, place, and time. No cranial nerve deficit. Coordination, balance, strength, speech and gait are normal.  Skin: Skin is warm and dry. No rash noted. No erythema.  Psychiatric: Patient has a normal mood and affect. behavior is normal. Judgment and thought content normal.     Assessment & Plan  1. Well adult exam (Primary)     -Prostate cancer screening and PSA options (with potential risks and benefits of testing vs not testing) were discussed along with recent recs/guidelines. -USPSTF grade A and B recommendations reviewed with patient; age-appropriate recommendations, preventive care, screening tests, etc discussed and encouraged; healthy living encouraged; see AVS for patient education given to patient -Discussed importance of 150 minutes of physical activity weekly, eat two servings of fish weekly, eat one serving of tree nuts ( cashews, pistachios, pecans, almonds.Aaron Aas) every other day, eat 6 servings of fruit/vegetables daily and drink plenty of water and avoid sweet beverages.  -Reviewed Health Maintenance: yes

## 2024-01-14 ENCOUNTER — Ambulatory Visit: Payer: Self-pay | Admitting: Family Medicine

## 2024-01-14 LAB — VITAMIN B1: Vitamin B1 (Thiamine): <6 nmol/L — ABNORMAL LOW (ref 8–30)

## 2024-01-14 LAB — CBC WITH DIFFERENTIAL/PLATELET
Absolute Lymphocytes: 2632 {cells}/uL (ref 850–3900)
Absolute Monocytes: 968 {cells}/uL — ABNORMAL HIGH (ref 200–950)
Basophils Absolute: 32 {cells}/uL (ref 0–200)
Basophils Relative: 0.4 %
Eosinophils Absolute: 128 {cells}/uL (ref 15–500)
Eosinophils Relative: 1.6 %
HCT: 48.7 % (ref 38.5–50.0)
Hemoglobin: 16.5 g/dL (ref 13.2–17.1)
MCH: 36.2 pg — ABNORMAL HIGH (ref 27.0–33.0)
MCHC: 33.9 g/dL (ref 32.0–36.0)
MCV: 106.8 fL — ABNORMAL HIGH (ref 80.0–100.0)
MPV: 12.8 fL — ABNORMAL HIGH (ref 7.5–12.5)
Monocytes Relative: 12.1 %
Neutro Abs: 4240 {cells}/uL (ref 1500–7800)
Neutrophils Relative %: 53 %
Platelets: 136 10*3/uL — ABNORMAL LOW (ref 140–400)
RBC: 4.56 10*6/uL (ref 4.20–5.80)
RDW: 13.4 % (ref 11.0–15.0)
Total Lymphocyte: 32.9 %
WBC: 8 10*3/uL (ref 3.8–10.8)

## 2024-01-14 LAB — B12 AND FOLATE PANEL
Folate: 2.3 ng/mL — ABNORMAL LOW
Vitamin B-12: 665 pg/mL (ref 200–1100)

## 2024-04-09 ENCOUNTER — Encounter: Payer: Self-pay | Admitting: Family Medicine

## 2024-04-09 ENCOUNTER — Ambulatory Visit (INDEPENDENT_AMBULATORY_CARE_PROVIDER_SITE_OTHER): Admitting: Family Medicine

## 2024-04-09 VITALS — BP 142/88 | HR 75 | Resp 16 | Ht 67.5 in | Wt 174.3 lb

## 2024-04-09 DIAGNOSIS — D696 Thrombocytopenia, unspecified: Secondary | ICD-10-CM

## 2024-04-09 DIAGNOSIS — J410 Simple chronic bronchitis: Secondary | ICD-10-CM | POA: Diagnosis not present

## 2024-04-09 DIAGNOSIS — F1021 Alcohol dependence, in remission: Secondary | ICD-10-CM

## 2024-04-09 DIAGNOSIS — I10 Essential (primary) hypertension: Secondary | ICD-10-CM | POA: Diagnosis not present

## 2024-04-09 DIAGNOSIS — F325 Major depressive disorder, single episode, in full remission: Secondary | ICD-10-CM

## 2024-04-09 DIAGNOSIS — I255 Ischemic cardiomyopathy: Secondary | ICD-10-CM | POA: Diagnosis not present

## 2024-04-09 DIAGNOSIS — I2510 Atherosclerotic heart disease of native coronary artery without angina pectoris: Secondary | ICD-10-CM

## 2024-04-09 DIAGNOSIS — Z23 Encounter for immunization: Secondary | ICD-10-CM | POA: Diagnosis not present

## 2024-04-09 DIAGNOSIS — M0609 Rheumatoid arthritis without rheumatoid factor, multiple sites: Secondary | ICD-10-CM | POA: Diagnosis not present

## 2024-04-09 DIAGNOSIS — I700 Atherosclerosis of aorta: Secondary | ICD-10-CM

## 2024-04-09 DIAGNOSIS — E539 Vitamin B deficiency, unspecified: Secondary | ICD-10-CM

## 2024-04-09 DIAGNOSIS — E559 Vitamin D deficiency, unspecified: Secondary | ICD-10-CM

## 2024-04-09 DIAGNOSIS — I420 Dilated cardiomyopathy: Secondary | ICD-10-CM

## 2024-04-09 MED ORDER — METOPROLOL SUCCINATE ER 25 MG PO TB24: 25.0000 mg | ORAL_TABLET | Freq: Every day | ORAL | 1 refills | Status: AC

## 2024-04-09 MED ORDER — LOSARTAN POTASSIUM 100 MG PO TABS: 100.0000 mg | ORAL_TABLET | Freq: Every day | ORAL | 1 refills | Status: AC

## 2024-04-09 MED ORDER — AMLODIPINE BESYLATE 5 MG PO TABS: 5.0000 mg | ORAL_TABLET | Freq: Every day | ORAL | 1 refills | Status: AC

## 2024-04-09 MED ORDER — ATORVASTATIN CALCIUM 80 MG PO TABS: 80.0000 mg | ORAL_TABLET | Freq: Every day | ORAL | 1 refills | Status: AC

## 2024-04-09 NOTE — Progress Notes (Signed)
 Name: Marcus Gray   MRN: [id number]    DOB: 01/14/1956   Date:04/09/2024       Progress Note  Subjective  Chief Complaint  Chief Complaint  Patient presents with   Medical Management of Chronic Issues    Pt has been out of bp meds   Discussed the use of AI scribe software for clinical note transcription with the patient, who gave verbal consent to proceed.  History of Present Illness Marcus Gray is a 68 year old male with hypertension and coronary artery disease who presents for medication management and follow-up.  He has been without losartan  for over a week due to a lack of refills, although he usually takes it daily. He also takes amlodipine  and metoprolol  for blood pressure management, with amlodipine  having two refills available. He takes atorvastatin  for cholesterol management and has it at home.  He has a history of ischemic dilated cardiomyopathy and coronary artery disease, for which he takes aspirin  81 mg daily. No chest pain, shortness of breath, or palpitations recently. His last cardiology visit was in September of the previous year.  He has a history of gout and rheumatoid arthritis. He takes allopurinol  150 mg daily for gout and has colchicine  for acute gout attacks. He has not seen his rheumatologist, Marcus Gray, in over a year and is not currently taking Plaquenil (hydroxychloroquine) for rheumatoid arthritis.  He has a history of vitamin deficiencies, including low vitamin D , B1, B12, and folic acid . He takes vitamin D3 daily but is not currently taking folic acid , which was noted to be low.  He drinks wine three times a week, sometimes in the morning and evening, and has reduced his alcohol intake compared to the past. He smokes five to six cigarettes per day and is attempting to quit. He experiences occasional cough but denies significant respiratory symptoms.  He reports that he is not feeling depressed. No need for medication for erectile dysfunction. No  current reflux symptoms.    Patient Active Problem List   Diagnosis Date Noted   Alcoholism in remission (HCC) 12/07/2022   Atherosclerosis of aorta (HCC) 06/07/2022   Smokers' cough (HCC) 12/08/2021   Presence of stent in coronary artery 12/08/2021   History of MI (myocardial infarction) 12/08/2021   Abnormal red blood cells 12/08/2021   Low folic acid  12/08/2021   B12 deficiency 12/08/2021   Mild episode of recurrent major depressive disorder (HCC) 12/08/2021   Ischemic dilated cardiomyopathy (HCC) 12/08/2021   Long-term use of immunosuppressant medication 01/17/2021   Rheumatoid arthritis of multiple sites with negative rheumatoid factor (HCC) 11/19/2020   Idiopathic chronic gout of multiple sites without tophus 11/17/2020   Gout of multiple sites 10/13/2020   Major depression in remission (HCC) 10/13/2020   Diastolic dysfunction 10/13/2020   Coronary artery disease involving native coronary artery of native heart without angina pectoris 06/17/2020   Hypertension, benign 06/17/2020   Hyperlipidemia LDL goal <70 06/17/2020    Past Surgical History:  Procedure Laterality Date   CARDIAC CATHETERIZATION     COLONOSCOPY WITH PROPOFOL  N/A 10/07/2020   Procedure: COLONOSCOPY WITH PROPOFOL ;  Surgeon: Therisa Bi, MD;  Location: [facility];  Service: Gastroenterology;  Laterality: N/A;   CORONARY STENT PLACEMENT  2016   [place]; OM1 and RCA    Family History  Problem Relation Age of Onset   Diabetes Mother    Emphysema Father    Cerebral aneurysm Daughter     Social History   Tobacco Use  Smoking status: Every Day    Current packs/day: 1.00    Average packs/day: 1 pack/day for 55.6 years (55.6 ttl pk-yrs)    Types: Cigarettes    Start date: 09/04/1968   Smokeless tobacco: Never   Tobacco comments:    4-5 cigarettes a day  Substance Use Topics   Alcohol use: Yes    Alcohol/week: 5.0 standard drinks of alcohol    Types: 5 Glasses of wine per week    Comment:  wine at night     Current Outpatient Medications:    albuterol  (VENTOLIN  HFA) 108 (90 Base) MCG/ACT inhaler, Inhale 2 puffs into the lungs every 4 (four) hours as needed for shortness of breath., Disp: 8 g, Rfl: 0   allopurinol  (ZYLOPRIM ) 100 MG tablet, Take 150 mg by mouth daily., Disp: , Rfl:    aspirin  EC 81 MG tablet, Take 1 tablet (81 mg total) by mouth daily., Disp: 30 tablet, Rfl: 0   cholecalciferol (VITAMIN D3) 25 MCG (1000 UNIT) tablet, Take 1,000 Units by mouth daily., Disp: , Rfl:    colchicine  0.6 MG tablet, Take 2 tablets (1.2 mg total) by mouth daily as needed. And may repeat in 24 hours if no improvement, after that may stop medication and take prn, Disp: 30 tablet, Rfl: 0   Cyanocobalamin  (B-12) 500 MCG SUBL, Place 1 tablet under the tongue daily at 12 noon., Disp: 100 tablet, Rfl: 1   folic acid  (FOLVITE ) 1 MG tablet, Take 1 mg by mouth daily., Disp: , Rfl:    hydroxychloroquine (PLAQUENIL) 200 MG tablet, Take 200 mg by mouth 2 (two) times daily., Disp: , Rfl:    sildenafil  (VIAGRA ) 100 MG tablet, Take 0.5-1 tablets (50-100 mg total) by mouth daily as needed for erectile dysfunction., Disp: 30 tablet, Rfl: 1   amLODipine  (NORVASC ) 5 MG tablet, Take 1 tablet (5 mg total) by mouth daily., Disp: 90 tablet, Rfl: 1   atorvastatin  (LIPITOR) 80 MG tablet, Take 1 tablet (80 mg total) by mouth daily., Disp: 90 tablet, Rfl: 1   losartan  (COZAAR ) 100 MG tablet, Take 1 tablet (100 mg total) by mouth daily., Disp: 90 tablet, Rfl: 1   metoprolol  succinate (TOPROL -XL) 25 MG 24 hr tablet, Take 1 tablet (25 mg total) by mouth daily., Disp: 90 tablet, Rfl: 1  No Known Allergies  I personally reviewed active problem list, medication list, allergies, family history with the patient/caregiver today.   ROS  Ten systems reviewed and is negative except as mentioned in HPI    Objective Physical Exam  CONSTITUTIONAL: Patient appears well-developed and well-nourished. No distress. HEENT: Head  atraumatic, normocephalic, neck supple. CARDIOVASCULAR: Normal rate, regular rhythm and normal heart sounds. No murmur heard. No edema in extremities. PULMONARY: Effort normal and breath sounds normal. Lungs clear to auscultation. No respiratory distress. MUSCULOSKELETAL: Normal gait. Without gross motor or sensory deficit. PSYCHIATRIC: Patient has a normal mood and affect. Behavior is normal. Judgment and thought content normal.  Vitals:   04/09/24 0855 04/09/24 0927  BP: (!) 142/88 (!) 142/88  Pulse: 75   Resp: 16   SpO2: 96%   Weight: 174 lb 4.8 oz (79.1 kg)   Height: 5' 7.5 (1.715 m)     Body mass index is 26.9 kg/m.   PHQ2/9:    04/09/2024    8:50 AM 01/09/2024    8:31 AM 10/08/2023    9:27 AM 04/09/2023    8:23 AM 12/07/2022    9:09 AM  Depression screen PHQ 2/9  Decreased Interest 0 0 0 0 0  Down, Depressed, Hopeless 0 0 0 0 0  PHQ - 2 Score 0 0 0 0 0  Altered sleeping 0 0 0 0 0  Tired, decreased energy 0 0 0 0 0  Change in appetite 0 0 0 0 0  Feeling bad or failure about yourself  0 0 0 0 0  Trouble concentrating 0 0 0 0 0  Moving slowly or fidgety/restless 0 0 0 0 0  Suicidal thoughts 0 0 0 0 0  PHQ-9 Score 0 0 0 0 0  Difficult doing work/chores Not difficult at all Not difficult at all Not difficult at all      phq 9 is negative  Fall Risk:    04/09/2024    8:50 AM 01/09/2024    8:31 AM 10/08/2023    9:24 AM 04/09/2023    8:23 AM 12/07/2022    9:09 AM  Fall Risk   Falls in the past year? 0 0 0 0 0  Number falls in past yr: 0 0 0 0   Injury with Fall? 0 0 0 0   Risk for fall due to : No Fall Risks No Fall Risks No Fall Risks No Fall Risks No Fall Risks  Follow up Falls evaluation completed Falls prevention discussed;Education provided;Falls evaluation completed Falls prevention discussed;Education provided;Falls evaluation completed Falls prevention discussed Falls prevention discussed     Assessment & Plan Essential hypertension Blood pressure elevated at  142/88 mmHg due to lapse in medication adherence. History of myocardial infarction increases risk of complications. - Refill losartan  prescription. - Educated on medication adherence importance. - Continue amlodipine  and metoprolol .  Atherosclerotic heart disease of native coronary artery without angina and ischemic cardiomyopathy Asymptomatic with ejection fraction of 45%. On atorvastatin  and aspirin  for coronary artery disease management. - Continue atorvastatin  and aspirin . - Educated on blood pressure control and medication adherence. - Refer to cardiologist for annual follow-up.  Rheumatoid arthritis without rheumatoid factor, multiple sites Managed by rheumatologist. Not on Plaquenil, needs regular follow-up to prevent joint inflammation. - Contact Marcus Gray for appointment. - Renew Plaquenil prescription. - Educated on importance of regular rheumatology follow-up.  Gout Managed with allopurinol . Prescription for colchicine  for acute attacks. - Continue allopurinol . - Use colchicine  as needed. - Ensure regular follow-up with rheumatologist.  Thrombocytopenia Previously noted thrombocytopenia without bleeding symptoms. - Repeat platelet count.  Vitamin D  and B deficiency Low levels of vitamin B1, B12, and folic acid , with particularly low folic acid . - Continue vitamin D3. - Start folic acid  supplementation. - Review vitamin B12 intake.  Simple chronic bronchitis (smoker's cough) Smokes 5-6 cigarettes per day, attempting to quit. Occasional cough, no significant dyspnea. - Encourage smoking cessation. - Discuss lung cancer screening.  Tobacco use disorder Current smoker, making efforts to quit. - Support smoking cessation efforts.  Alcohol dependence, in remission Drinks wine three times a week, indicating remission. - Continue monitoring alcohol intake. - Encourage maintaining current consumption level.  Major depressive disorder, single episode, in full  remission Reports feeling well, no symptoms of depression.  General Health Maintenance Due for annual influenza vaccination due to cardiac and pulmonary history. - Administer influenza vaccine today.

## 2024-04-09 NOTE — Patient Instructions (Addendum)
 Team Member Role and Visual merchandiser Info Address Start End Comments  Tobie Lady POUR, MD Consulting Physician (Rheumatology) Phone: [phone] Fax: [phone] [address] [place] [address] 10/08/2023 - -   Team Member Role and Specialty Contact Info Address Start End Comments  Mady Bruckner, MD Cardiology (Cardiology) Phone: [phone] Fax: [phone] [address] [place] [address] 07/07/2020 - -

## 2024-04-15 ENCOUNTER — Ambulatory Visit: Payer: Self-pay | Admitting: Family Medicine

## 2024-04-15 LAB — LIPID PANEL
Cholesterol: 184 mg/dL (ref <200)
HDL: 43 mg/dL (ref >=40)
LDL Cholesterol (Calc): 111 mg/dL — ABNORMAL HIGH
Non-HDL Cholesterol (Calc): 141 mg/dL — ABNORMAL HIGH (ref <130)
Total CHOL/HDL Ratio: 4.3 (calc) (ref <5.0)
Triglycerides: 183 mg/dL — ABNORMAL HIGH (ref <150)

## 2024-04-15 LAB — B12 AND FOLATE PANEL
Folate: 2.4 ng/mL — ABNORMAL LOW
Vitamin B-12: 803 pg/mL (ref 200–1100)

## 2024-04-15 LAB — COMPREHENSIVE METABOLIC PANEL WITH GFR
AG Ratio: 1.5 (calc) (ref 1.0–2.5)
ALT: 22 U/L (ref 9–46)
AST: 23 U/L (ref 10–35)
Albumin: 4.3 g/dL (ref 3.6–5.1)
Alkaline phosphatase (APISO): 68 U/L (ref 35–144)
BUN: 7 mg/dL (ref 7–25)
CO2: 28 mmol/L (ref 20–32)
Calcium: 9.3 mg/dL (ref 8.6–10.3)
Chloride: 105 mmol/L (ref 98–110)
Creat: 0.95 mg/dL (ref 0.70–1.35)
Globulin: 2.9 g/dL (ref 1.9–3.7)
Glucose, Bld: 100 mg/dL — ABNORMAL HIGH (ref 65–99)
Potassium: 4.4 mmol/L (ref 3.5–5.3)
Sodium: 140 mmol/L (ref 135–146)
Total Bilirubin: 0.7 mg/dL (ref 0.2–1.2)
Total Protein: 7.2 g/dL (ref 6.1–8.1)
eGFR: 87 mL/min/1.73m2 (ref >=60)

## 2024-04-15 LAB — CBC WITH DIFFERENTIAL/PLATELET
Absolute Lymphocytes: 1746 {cells}/uL (ref 850–3900)
Absolute Monocytes: 737 {cells}/uL (ref 200–950)
Basophils Absolute: 41 {cells}/uL (ref 0–200)
Basophils Relative: 0.7 %
Eosinophils Absolute: 110 {cells}/uL (ref 15–500)
Eosinophils Relative: 1.9 %
HCT: 48.2 % (ref 38.5–50.0)
Hemoglobin: 16.8 g/dL (ref 13.2–17.1)
MCH: 37.3 pg — ABNORMAL HIGH (ref 27.0–33.0)
MCHC: 34.9 g/dL (ref 32.0–36.0)
MCV: 106.9 fL — ABNORMAL HIGH (ref 80.0–100.0)
MPV: 12.4 fL (ref 7.5–12.5)
Monocytes Relative: 12.7 %
Neutro Abs: 3167 {cells}/uL (ref 1500–7800)
Neutrophils Relative %: 54.6 %
Platelets: 136 Thousand/uL — ABNORMAL LOW (ref 140–400)
RBC: 4.51 Million/uL (ref 4.20–5.80)
RDW: 14 % (ref 11.0–15.0)
Total Lymphocyte: 30.1 %
WBC: 5.8 Thousand/uL (ref 3.8–10.8)

## 2024-04-15 LAB — VITAMIN D 25 HYDROXY (VIT D DEFICIENCY, FRACTURES): Vit D, 25-Hydroxy: 29 ng/mL — ABNORMAL LOW (ref 30–100)

## 2024-04-15 LAB — EXTRA

## 2024-04-15 LAB — VITAMIN B1: Vitamin B1 (Thiamine): <6 nmol/L — ABNORMAL LOW (ref 8–30)

## 2024-05-22 ENCOUNTER — Ambulatory Visit: Attending: Cardiology | Admitting: Cardiology

## 2024-05-22 NOTE — Progress Notes (Deleted)
 Cardiology Office Note   Date:  05/22/2024  ID:  [facility] [place], [place] 1956/05/17, MRN [id number] PCP: Glenard Mire, MD  [facility] Providers Cardiologist:  Lonni Hanson, MD { Click to update primary MD,subspecialty MD or APP then REFRESH:1}    History of Present Illness Marcus Gray is a 68 y.o. male with a past medical history of coronary artery disease status post PCI/DES to RCA and OM1 (2015), ischemic cardiomyopathy (EF 45%, 2015), history of alcohol abuse, thrombocytopenia, hypertension, hyperlipidemia, arthritis, who presents today for follow-up.   He is previously admitted to the hospital in 2015 after he was referred to the ED for abnormal liver test with concern for Tylenol toxicity and empirically treated with N acetylcysteine.  Echo showed mild concentric LVH, LVEF 45%, inferior wall severe hypokinesis, basal posterior lateral wall hypokinesis, mild LA enlargement, moderate MR without stenosis.  Due to wall motion abnormalities he was evaluated by cardiology and underwent balloon angioplasty and DES to the RCA which was 90% occluded in the OM1.  Reported an MI in 2016 with PCI to OM1 and mid RCA at that time.  He was seen in the [facility] emergency department June 2021 complaining of chest pain radiating to his back.  His CTA of the chest and high-sensitivity troponin unrevealing related to the diagnosis of bronchitis.  Echocardiogram in January 2022 revealed LVEF of 40-45%, hypokinesis of the inferior wall, G1 DD, mild MR.   Was seen in clinic 12/18/2022.  Recently followed up with PCP blood work done that was all within normal limits.  Denied any angina or anginal equivalents.  Blood pressure had been running 130s to 150s.  Was continued on his Toprol  and losartan  was increased to 100 mg daily and was scheduled for repeat chemistry.  Also discussed repeating echocardiogram on return.   Last seen in clinic 04/04/23.  At that point time was doing well.  Was scheduled  for repeat echocardiogram.  Was started on amlodipine  for elevated blood pressures.  Was to return after echocardiogram was completed.  He returns clinic today  ROS: 10 point review of system has been reviewed and considered negative except ones been listed in the HPI  Studies Reviewed     2d echo 04/30/2023 1. Left ventricular ejection fraction, by estimation, is 45 to 50%. The  left ventricle has mildly decreased function. The left ventricle  demonstrates global hypokinesis. Left ventricular diastolic parameters are  consistent with Grade I diastolic  dysfunction (impaired relaxation). The average left ventricular global  longitudinal strain is -18.6 %. The global longitudinal strain is normal.   2. Right ventricular systolic function is normal. The right ventricular  size is normal.   3. Left atrial size was mildly dilated.   4. The mitral valve is normal in structure. Mild mitral valve  regurgitation.   5. The aortic valve is tricuspid. Aortic valve regurgitation is not  visualized.   6. The inferior vena cava is normal in size with greater than 50%  respiratory variability, suggesting right atrial pressure of 3 mmHg.   TTE 08/27/20 1. Left ventricular ejection fraction, by estimation, is 40 to 45%. The  left ventricle has mildly decreased function. Hypokinesis of the infeiror  wall. Left ventricular diastolic parameters are consistent with Grade I  diastolic dysfunction (impaired  relaxation).   2. Right ventricular systolic function is normal. The right ventricular  size is normal.   3. The mitral valve is normal in structure. Mild mitral valve  regurgitation. No evidence  of mitral stenosis.   Risk Assessment/Calculations   No BP recorded.  {Refresh Note OR Click here to enter BP  :1}***       Physical Exam VS:  There were no vitals taken for this visit.       Wt Readings from Last 3 Encounters:  04/09/24 174 lb 4.8 oz (79.1 kg)  01/09/24 173 lb 14.4 oz (78.9 kg)   10/08/23 174 lb 3.2 oz (79 kg)    GEN: Well nourished, well developed in no acute distress NECK: No JVD; No carotid bruits CARDIAC: ***RRR, no murmurs, rubs, gallops RESPIRATORY:  Clear to auscultation without rales, wheezing or rhonchi  ABDOMEN: Soft, non-tender, non-distended EXTREMITIES:  No edema; No deformity   ASSESSMENT AND PLAN Coronary artery disease Ischemic cardiomyopathy Hypertension Mixed hyperlipidemia Rheumatoid arthritis History of alcohol abuse    {Are you ordering a CV Procedure (e.g. stress test, cath, DCCV, TEE, etc)?   Press F2        :[id number]}  Dispo: ***  Signed, Roddy Bellamy, NP

## 2024-05-23 ENCOUNTER — Encounter: Payer: Self-pay | Admitting: Cardiology

## 2024-06-04 ENCOUNTER — Encounter: Payer: Self-pay | Admitting: Cardiology

## 2024-06-04 ENCOUNTER — Ambulatory Visit: Attending: Cardiology | Admitting: Cardiology

## 2024-06-04 VITALS — BP 130/72 | HR 60 | Ht 72.0 in | Wt 177.5 lb

## 2024-06-04 DIAGNOSIS — I255 Ischemic cardiomyopathy: Secondary | ICD-10-CM

## 2024-06-04 DIAGNOSIS — M0609 Rheumatoid arthritis without rheumatoid factor, multiple sites: Secondary | ICD-10-CM

## 2024-06-04 DIAGNOSIS — I2510 Atherosclerotic heart disease of native coronary artery without angina pectoris: Secondary | ICD-10-CM | POA: Diagnosis not present

## 2024-06-04 DIAGNOSIS — F1021 Alcohol dependence, in remission: Secondary | ICD-10-CM

## 2024-06-04 DIAGNOSIS — I10 Essential (primary) hypertension: Secondary | ICD-10-CM

## 2024-06-04 DIAGNOSIS — Z720 Tobacco use: Secondary | ICD-10-CM

## 2024-06-04 DIAGNOSIS — I420 Dilated cardiomyopathy: Secondary | ICD-10-CM

## 2024-06-04 DIAGNOSIS — E785 Hyperlipidemia, unspecified: Secondary | ICD-10-CM | POA: Diagnosis not present

## 2024-06-04 NOTE — Progress Notes (Signed)
 Cardiology Office Note   Date:  06/04/2024  ID:  [facility] [place], [place] [birth year], MRN [id number] PCP: Glenard Mire, MD  [facility] Providers Cardiologist:  Lonni Hanson, MD     History of Present Illness Marcus Gray is a 68 y.o. male with a past medical history of coronary artery disease status post PCI/DES to RCA and OM1 (2015), ischemic cardiomyopathy (EF 45%, 2015), history of alcohol abuse, thrombocytopenia, hypertension, hyperlipidemia, arthritis, who presents today for follow-up.   He is previously admitted to the hospital in 2015 after he was referred to the ED for abnormal liver test with concern for Tylenol toxicity and empirically treated with N acetylcysteine.  Echo showed mild concentric LVH, LVEF 45%, inferior wall severe hypokinesis, basal posterior lateral wall hypokinesis, mild LA enlargement, moderate MR without stenosis.  Due to wall motion abnormalities he was evaluated by cardiology and underwent balloon angioplasty and DES to the RCA which was 90% occluded in the OM1.  Reported an MI in 2016 with PCI to OM1 and mid RCA at that time.  He was seen in the [facility] emergency department June 2021 complaining of chest pain radiating to his back.  His CTA of the chest and high-sensitivity troponin unrevealing related to the diagnosis of bronchitis.  Echocardiogram in January 2022 revealed LVEF of 40-45%, hypokinesis of the inferior wall, G1 DD, mild MR.   Was seen in clinic 12/18/2022.  Recently followed up with PCP blood work done that was all within normal limits.  Denied any angina or anginal equivalents.  Blood pressure had been running 130s to 150s.  Was continued on his Toprol  and losartan  was increased to 100 mg daily and was scheduled for repeat chemistry.  Also discussed repeating echocardiogram on return.   Last seen in clinic 04/04/23.  At that point time was doing well.  Was scheduled for repeat echocardiogram.  Was started on amlodipine  for elevated  blood pressures.  Was to return after echocardiogram was completed.  All information was obtained through interpreter Brad #[id number]   He returns clinic today stating that he has been well from the cardiac perspective.  He denies any chest pain, shortness breath, dyspnea on exertion, peripheral edema.  States that he has been compliant with his current medication regimen without any undue side effects.  Denies any hospitalizations or visits to the emergency department.  ROS: 10 point review of system has been reviewed and considered negative except ones been listed in the HPI  Studies Reviewed EKG Interpretation Date/Time:  Wednesday June 04 2024 10:13:28 EST Ventricular Rate:  60 PR Interval:  166 QRS Duration:  106 QT Interval:  466 QTC Calculation: 466 R Axis:   -21  Text Interpretation: Normal sinus rhythm Inferior infarct (cited on or before 09-Jan-2020) Cannot rule out Anterior infarct (cited on or before 09-Jan-2020) When compared with ECG of 17-May-2022 11:57, No significant change since last tracing Confirmed by Gerard Frederick ([id number]) on 06/04/2024 10:16:20 AM    2d echo 04/30/2023 1. Left ventricular ejection fraction, by estimation, is 45 to 50%. The  left ventricle has mildly decreased function. The left ventricle  demonstrates global hypokinesis. Left ventricular diastolic parameters are  consistent with Grade I diastolic  dysfunction (impaired relaxation). The average left ventricular global  longitudinal strain is -18.6 %. The global longitudinal strain is normal.   2. Right ventricular systolic function is normal. The right ventricular  size is normal.   3. Left atrial size was mildly dilated.   4. The  mitral valve is normal in structure. Mild mitral valve  regurgitation.   5. The aortic valve is tricuspid. Aortic valve regurgitation is not  visualized.   6. The inferior vena cava is normal in size with greater than 50%  respiratory variability, suggesting right  atrial pressure of 3 mmHg.   TTE 08/27/20 1. Left ventricular ejection fraction, by estimation, is 40 to 45%. The  left ventricle has mildly decreased function. Hypokinesis of the infeiror  wall. Left ventricular diastolic parameters are consistent with Grade I  diastolic dysfunction (impaired  relaxation).   2. Right ventricular systolic function is normal. The right ventricular  size is normal.   3. The mitral valve is normal in structure. Mild mitral valve  regurgitation. No evidence of mitral stenosis.   Risk Assessment/Calculations           Physical Exam VS:  BP 130/72 (BP Location: Left Arm, Patient Position: Sitting, Cuff Size: Normal)   Pulse 60   Ht 6' (1.829 m)   Wt 177 lb 8 oz (80.5 kg)   SpO2 98%   BMI 24.07 kg/m        Wt Readings from Last 3 Encounters:  06/04/24 177 lb 8 oz (80.5 kg)  04/09/24 174 lb 4.8 oz (79.1 kg)  01/09/24 173 lb 14.4 oz (78.9 kg)    GEN: Well nourished, well developed in no acute distress NECK: No JVD; No carotid bruits CARDIAC: RRR, no murmurs, rubs, gallops RESPIRATORY:  Clear to auscultation without rales, wheezing or rhonchi  ABDOMEN: Soft, non-tender, non-distended EXTREMITIES:  No edema; No deformity   ASSESSMENT AND PLAN Coronary artery disease/ischemic cardiomyopathy status post DES to the RCA and OM1 in 2016.  Last echocardiogram revealed an LVEF of 45-50% with mildly decreased function, G1 DD, and mild mitral valve regurgitation noted.  He denies any shortness of breath or chest discomfort.  Appears to be euvolemic on exam today.  He is continued on Toprol -XL 25 mg daily losartan  100 mg daily.  He is continued on aspirin  81 mg daily and atorvastatin  80 mg daily.  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  No further ischemic evaluation is needed at this time.  Hypertension with blood pressure today 130/72.  Blood pressure has been  well-controlled.  He has been continued on amlodipine  5 mg daily losartan  100 mg daily and Toprol -XL 25 mg daily.  He has been encouraged to monitor his pressure 1 to 2 hours postmedication administration at home as well.  Mixed hyperlipidemia with an LDL of 111.  He is continued on atorvastatin  80 mg daily.  Goal of LDL is 70 or less.  Discussed he has had to make some dietary changes and would like to have his cholesterol reevaluated prior to starting any new medications.  Deferred starting ezetimibe 10 mg daily today.  If LDL continues to remain elevated consider starting.  Rheumatoid arthritis where he states he had been off of his Plaquenil and his primary care provider had just renewed his prescription and has not seen his rheumatologist in over a year.  History of alcohol/tobacco abuse patient states that he drinks wine approximately 3 times per week.  He has reduced his alcohol intake today compared to the past.  Continues to smoke 5 to 6 cigarettes/day but is attempting to quit.  Total cessation is recommended.       Dispo: Patient to return to clinic to  see me/APP in 11 to 12 months or sooner if needed for reevaluation  Signed, Nyheim Seufert, NP

## 2024-06-04 NOTE — Patient Instructions (Signed)
 Medication Instructions:  Your physician recommends that you continue on your current medications as directed. Please refer to the Current Medication list given to you today.   *If you need a refill on your cardiac medications before your next appointment, please call your pharmacy*  Lab Work: No labs ordered today  If you have labs (blood work) drawn today and your tests are completely normal, you will receive your results only by: MyChart Message (if you have MyChart) OR A paper copy in the mail If you have any lab test that is abnormal or we need to change your treatment, we will call you to review the results.  Testing/Procedures: No test ordered today   Follow-Up: At [facility], you and your health needs are our priority.  As part of our continuing mission to provide you with exceptional heart care, our providers are all part of one team.  This team includes your primary Cardiologist (physician) and Advanced Practice Providers or APPs (Physician Assistants and Nurse Practitioners) who all work together to provide you with the care you need, when you need it.  Your next appointment:   12 month(s)  Provider:   You may see Lonni Hanson, MD or one of the following Advanced Practice Providers on your designated Care Team:  Tylene Lunch, NP  We recommend signing up for the patient portal called MyChart.  Sign up information is provided on this After Visit Summary.  MyChart is used to connect with patients for Virtual Visits (Telemedicine).  Patients are able to view lab/test results, encounter notes, upcoming appointments, etc.  Non-urgent messages can be sent to your provider as well.   To learn more about what you can do with MyChart, go to [email or web address].

## 2024-10-07 ENCOUNTER — Ambulatory Visit: Admitting: Family Medicine
